# Patient Record
Sex: Male | Born: 1995 | Race: White | Hispanic: No | Marital: Single | Smoking: Never smoker
Health system: Southern US, Community
[De-identification: ages and names within clinical notes are randomized; demographics above are authoritative.]

## PROBLEM LIST (undated history)

## (undated) HISTORY — PX: HERNIA REPAIR: SHX51

---
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
## 2016-03-03 ENCOUNTER — Encounter: Payer: Self-pay | Admitting: Allergy and Immunology

## 2016-03-03 ENCOUNTER — Ambulatory Visit (INDEPENDENT_AMBULATORY_CARE_PROVIDER_SITE_OTHER): Payer: BLUE CROSS/BLUE SHIELD | Admitting: Allergy and Immunology

## 2016-03-03 VITALS — BP 110/78 | HR 80 | Temp 97.7°F | Resp 16 | Ht 69.69 in | Wt 140.7 lb

## 2016-03-03 DIAGNOSIS — Z889 Allergy status to unspecified drugs, medicaments and biological substances status: Secondary | ICD-10-CM

## 2016-03-03 MED ORDER — AMOXICILLIN 200 MG/5ML PO SUSR: ORAL | Status: DC

## 2016-03-03 NOTE — Progress Notes (Signed)
New Patient Note  RE: Isaac Atkins MRN: [id number] DOB: [birth year] Date of Office Visit: 03/03/2016  Referring provider: Excell SeltzerBedsole, Amy E, MD Primary care provider: Kerby NoraAmy Bedsole, MD  Chief Complaint: History of Drug Allergy and Allergy Testing   History of present illness: HPI Comments: Isaac Atkins is a 20 y.o. male presenting today for consultation of possible penicillin allergy.  He has submitted an application for the [employer] and has been told that penicillin allergy has to be ruled out.  Apparently, as an infant he was given penicillin and vomited.  He is uncertain if there were additional symptoms.  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   Assessment and plan: History of drug allergy Epicutaneous and intradermal skin tests were negative to Pre-Pen and pen G and all dilutions.  We will proceed to amoxicillin challenge.  The patient is scheduled to return next week for graded amoxicillin challenge.  Further recommendations will be made at that time based upon skin test results.    Meds ordered this encounter  Medications  . amoxicillin (AMOXIL) 200 MG/5ML suspension    Sig: BRING IN TO CLINIC FOR ORAL CHALLENGE    Dispense:  100 mL    Refill:  0    DO NOT MIX-LEAVE AS POWDER FOR ORAL CHALLENGE IN CLINIC    Diagnositics: Pre-Pen and pen G skin testing: Negative at all dilutions despite a positive histamine control.    Physical examination: Blood pressure 110/78, pulse 80, temperature 97.7 F (36.5 C), temperature source Oral, resp. rate 16, height 5' 9.69" (1.77 m), weight 140 lb 10.5 oz (63.8 kg).  General: Alert, interactive, in no acute distress. Lungs: Clear to auscultation without wheezing, rhonchi or rales. CV: Normal S1, S2 without murmurs. Abdomen: Nondistended, nontender. Skin: Warm and dry, without lesions or rashes. Extremities:  No clubbing, cyanosis or edema. Neuro:   Grossly  intact.  Review of systems:  Review of Systems  Constitutional: Negative for fever, chills and weight loss.  HENT: Negative for congestion and nosebleeds.   Eyes: Negative for blurred vision.  Respiratory: Negative for cough, hemoptysis, shortness of breath and wheezing.   Cardiovascular: Negative for chest pain.  Gastrointestinal: Negative for diarrhea and constipation.  Genitourinary: Negative for dysuria.  Musculoskeletal: Negative for myalgias and joint pain.  Neurological: Negative for dizziness.  Endo/Heme/Allergies: Negative for environmental allergies. Does not bruise/bleed easily.    Past medical history:  History reviewed. No pertinent past medical history.  Past surgical history:  Past Surgical History  Procedure Laterality Date  . Hernia repair      Family history: Family History  Problem Relation Age of Onset  . Sudden death Neg Hx   . Heart attack Neg Hx   . Allergic rhinitis Neg Hx   . Asthma Neg Hx     Social history: Social History   Social History  . Marital Status: Single    Spouse Name: N/A  . Number of Children: N/A  . Years of Education: N/A   Occupational History  . Not on file.   Social History Main Topics  . Smoking status: Never Smoker   . Smokeless tobacco: Never Used  . Alcohol Use: No  . Drug Use: No  . Sexual Activity: Not on file   Other Topics Concern  . Not on file   Social History Narrative   Environmental History: The patient lives in an old house with hardwood floors  throughout and central air/heat.  There are dogs in the house which have access to his bedroom.  He is a nonsmoker.    Medication List       This list is accurate as of: 03/03/16  1:05 PM.  Always use your most recent med list.               amoxicillin 200 MG/5ML suspension  Commonly known as:  AMOXIL  BRING IN TO CLINIC FOR ORAL CHALLENGE        Known medication allergies: Allergies  Allergen Reactions  . Penicillins     REACTION: Nausea  and vomiting    I appreciate the opportunity to take part in this Isaac Atkins's care. Please do not hesitate to contact me with questions.  Sincerely,   R. Jorene Guest, MD Possible penicillin allergy.

## 2016-03-03 NOTE — Assessment & Plan Note (Signed)
Epicutaneous and intradermal skin tests were negative to Pre-Pen and pen G and all dilutions.  We will proceed to amoxicillin challenge.  The patient is scheduled to return next week for graded amoxicillin challenge.  Further recommendations will be made at that time based upon skin test results.

## 2016-03-03 NOTE — Patient Instructions (Signed)
History of drug allergy Epicutaneous and intradermal skin tests were negative to Pre-Pen and pen G and all dilutions.  We will proceed to amoxicillin challenge.  The patient is scheduled to return next week for graded amoxicillin challenge.  Further recommendations will be made at that time based upon skin test results.   Return in about 7 days (around 03/10/2016) for amoxicillin challenge.

## 2016-03-10 ENCOUNTER — Telehealth: Payer: Self-pay | Admitting: Allergy and Immunology

## 2016-03-10 ENCOUNTER — Ambulatory Visit (INDEPENDENT_AMBULATORY_CARE_PROVIDER_SITE_OTHER): Payer: BLUE CROSS/BLUE SHIELD | Admitting: Allergy and Immunology

## 2016-03-10 ENCOUNTER — Encounter: Payer: Self-pay | Admitting: Allergy and Immunology

## 2016-03-10 VITALS — BP 108/74 | HR 76 | Resp 16

## 2016-03-10 DIAGNOSIS — Z889 Allergy status to unspecified drugs, medicaments and biological substances status: Secondary | ICD-10-CM | POA: Diagnosis not present

## 2016-03-10 NOTE — Assessment & Plan Note (Signed)
The patient had negative skin tests to Pre-Pen and penicillin G and was able to tolerate the graded amoxicillin challenge today without adverse signs or symptoms. Therefore, Isaac Atkins has the same risk of systemic reaction associated with penicillin as the general population.

## 2016-03-10 NOTE — Telephone Encounter (Signed)
I called Isaac Atkins to check in and make sure that he was not having any symptoms after the amoxicillin challenge.  He assured me that he was feeling well without any adverse cutaneous, cardiopulmonary, or GI symptoms.  I asked him to contact the office or the on-call physician should he develop symptoms and, if he developed serious symptoms to call 911.  He verbalized understanding and agreed to do so.

## 2016-03-10 NOTE — Patient Instructions (Addendum)
History of drug allergy The patient had negative skin tests to Pre-Pen and penicillin G and was able to tolerate the graded amoxicillin challenge today without adverse signs or symptoms. Therefore, Isaac Atkins has the same risk of systemic reaction associated with penicillin as the general population.    Return visit to be determined.

## 2016-03-10 NOTE — Progress Notes (Signed)
    Follow-up Note  RE: Isaac Atkins MRN: [id number] DOB: 04/14/1996 Date of Office Visit: 03/10/2016  Primary care provider: Kerby NoraAmy Bedsole, MD Referring provider: Excell SeltzerBedsole, Amy E, MD  History of present illness: HPI Comments: Isaac Atkins is a 20 y.o. male presents today for amoxicillin challenge.  During his previous office visit he had negative skin tests to Pre-Pen and penicillin G.   Assessment and plan: History of drug allergy The patient had negative skin tests to Pre-Pen and penicillin G and was able to tolerate the graded amoxicillin challenge today without adverse signs or symptoms. Therefore, Isaac Pertvan has the same risk of systemic reaction associated with penicillin as the general population.    Diagnositics: Graded amoxicillin challenge: The patient was able to tolerate the graded amoxicillin challenge today without adverse signs or symptoms. Vital signs were stable throughout the challenge and observation period.     Physical examination: Blood pressure 108/74, pulse 76, resp. rate 16.  General: Alert, interactive, in no acute distress. Lungs: Clear to auscultation without wheezing, rhonchi or rales. CV: Normal S1, S2 without murmurs. Skin: Warm and dry, without lesions or rashes.  The following portions of the patient's history were reviewed and updated as appropriate: allergies, current medications, past family history, past medical history, past social history, past surgical history and problem list.    Medication List       This list is accurate as of: 03/10/16  7:37 PM.  Always use your most recent med list.               amoxicillin 200 MG/5ML suspension  Commonly known as:  AMOXIL  BRING IN TO CLINIC FOR ORAL CHALLENGE        Allergies  Allergen Reactions  . Penicillins     REACTION: Nausea and vomiting    I appreciate the opportunity to take part in this Isaac Atkins's care. Please do not hesitate to contact me with questions.  Sincerely,   R. Jorene Guestarter  Ladislaus Repsher, MD

## 2016-03-11 ENCOUNTER — Telehealth: Payer: Self-pay

## 2016-03-11 NOTE — Telephone Encounter (Signed)
Patient was just calling to let Dr. Nunzio CobbsBobbitt know he has been doing fine after his challenge.   Thanks

## 2016-03-11 NOTE — Telephone Encounter (Signed)
Noted, thanks.  I will write a letter for his chief medical officer stating that he is not allergic to penicillin.

## 2016-03-27 ENCOUNTER — Ambulatory Visit (INDEPENDENT_AMBULATORY_CARE_PROVIDER_SITE_OTHER): Payer: BLUE CROSS/BLUE SHIELD | Admitting: Family Medicine

## 2016-03-27 ENCOUNTER — Ambulatory Visit (HOSPITAL_BASED_OUTPATIENT_CLINIC_OR_DEPARTMENT_OTHER)
Admission: RE | Admit: 2016-03-27 | Discharge: 2016-03-27 | Disposition: A | Discharge status: Home / Self Care | Payer: BLUE CROSS/BLUE SHIELD | Source: Ambulatory Visit | Attending: Family Medicine | Admitting: Family Medicine

## 2016-03-27 ENCOUNTER — Encounter: Payer: Self-pay | Admitting: Family Medicine

## 2016-03-27 VITALS — BP 113/71 | HR 65 | Ht 71.0 in | Wt 140.0 lb

## 2016-03-27 DIAGNOSIS — M79671 Pain in right foot: Secondary | ICD-10-CM | POA: Insufficient documentation

## 2016-03-27 DIAGNOSIS — S99921A Unspecified injury of right foot, initial encounter: Secondary | ICD-10-CM | POA: Diagnosis not present

## 2016-03-27 NOTE — Progress Notes (Signed)
PCP: Kerby NoraAmy Bedsole, MD  Subjective:   HPI: Patient is a 20 y.o. male here for right foot injury.  Patient reports 1 week ago he was skateboarding when he inverted his right ankle. Immediate pain, some swelling lateral foot dorsally. Pain level is 3/10 and sharp at worst. Has improved since the initial injury. Worse with walking, better with rest. No skin changes, numbness.  No past medical history on file.  Current Outpatient Prescriptions on File Prior to Visit  Medication Sig Dispense Refill  . amoxicillin (AMOXIL) 200 MG/5ML suspension BRING IN TO CLINIC FOR ORAL CHALLENGE (Patient not taking: Reported on 03/10/2016) 100 mL 0   No current facility-administered medications on file prior to visit.    Past Surgical History  Procedure Laterality Date  . Hernia repair      No Active Allergies  Social History   Social History  . Marital Status: Single    Spouse Name: N/A  . Number of Children: N/A  . Years of Education: N/A   Occupational History  . Not on file.   Social History Main Topics  . Smoking status: Never Smoker   . Smokeless tobacco: Never Used  . Alcohol Use: No  . Drug Use: No  . Sexual Activity: Not on file   Other Topics Concern  . Not on file   Social History Narrative    Family History  Problem Relation Age of Onset  . Sudden death Neg Hx   . Heart attack Neg Hx   . Allergic rhinitis Neg Hx   . Asthma Neg Hx     BP 113/71 mmHg  Pulse 65  Ht 5\' 11"  (1.803 m)  Wt 140 lb (63.504 kg)  BMI 19.53 kg/m2  Review of Systems: See HPI above.    Objective:  Physical Exam:  Gen: NAD, comfortable in exam room  Right foot/ankle: No gross deformity, swelling, ecchymoses FROM ankle without pain. TTP mildly over cuboid only.  No other tenderness. Negative ant drawer and talar tilt.   Negative syndesmotic compression. Thompsons test negative. NV intact distally.  Left foot/ankle: FROM without pain.    Assessment & Plan:  1. Right foot  injury - independently reviewed radiographs today and no evidence fracture or other acute abnormalities.  Brief MSK u/s with normal peroneal tendons, no occult fracture.  2/2 sprain.  Reassured patient.  Icing, tylenol or nsaids if needed.  F/u in 4 weeks if needed.

## 2016-03-27 NOTE — Assessment & Plan Note (Signed)
independently reviewed radiographs today and no evidence fracture or other acute abnormalities.  Brief MSK u/s with normal peroneal tendons, no occult fracture.  2/2 sprain.  Reassured patient.  Icing, tylenol or nsaids if needed.  F/u in 4 weeks if needed.

## 2016-03-27 NOTE — Patient Instructions (Signed)
You have a foot sprain. Expect this to take another 1-3 weeks to completely resolve. Ice the area 15 minutes at a time 3-4 times a day. Consider boot or postop shoe (I don't think you need this unless pain is severe) when up and walking around. Ibuprofen 600mg  three times a day with food OR aleve 2 tabs twice a day with food for pain and inflammation. Follow up with me in 4 weeks if you're having problems.

## 2016-10-27 IMAGING — CR DG FOOT COMPLETE 3+V*R*
3 series · 3 of 3 positions shown · non-contrast
Comparison: None.

CLINICAL DATA: Lateral and anterior foot pain from skateboard
injury a few days ago. Cuboid region pain. Initial encounter.

EXAM:
RIGHT FOOT COMPLETE - 3+ VIEW

[t foot ap right]
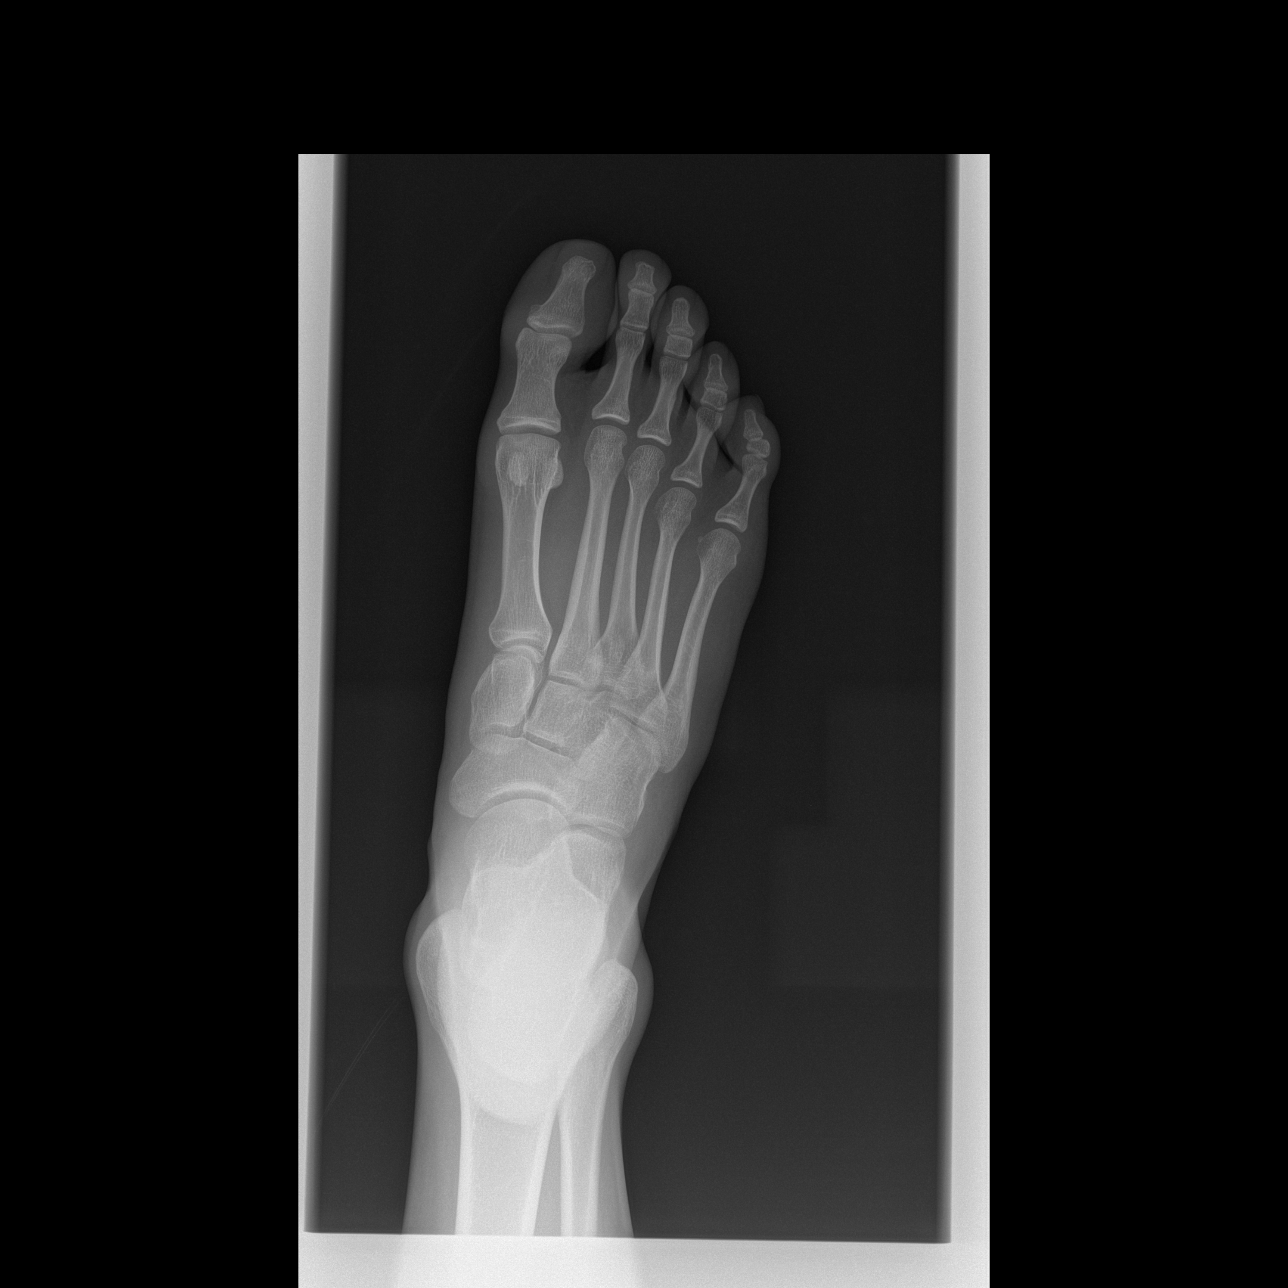

[t foot oblique right]
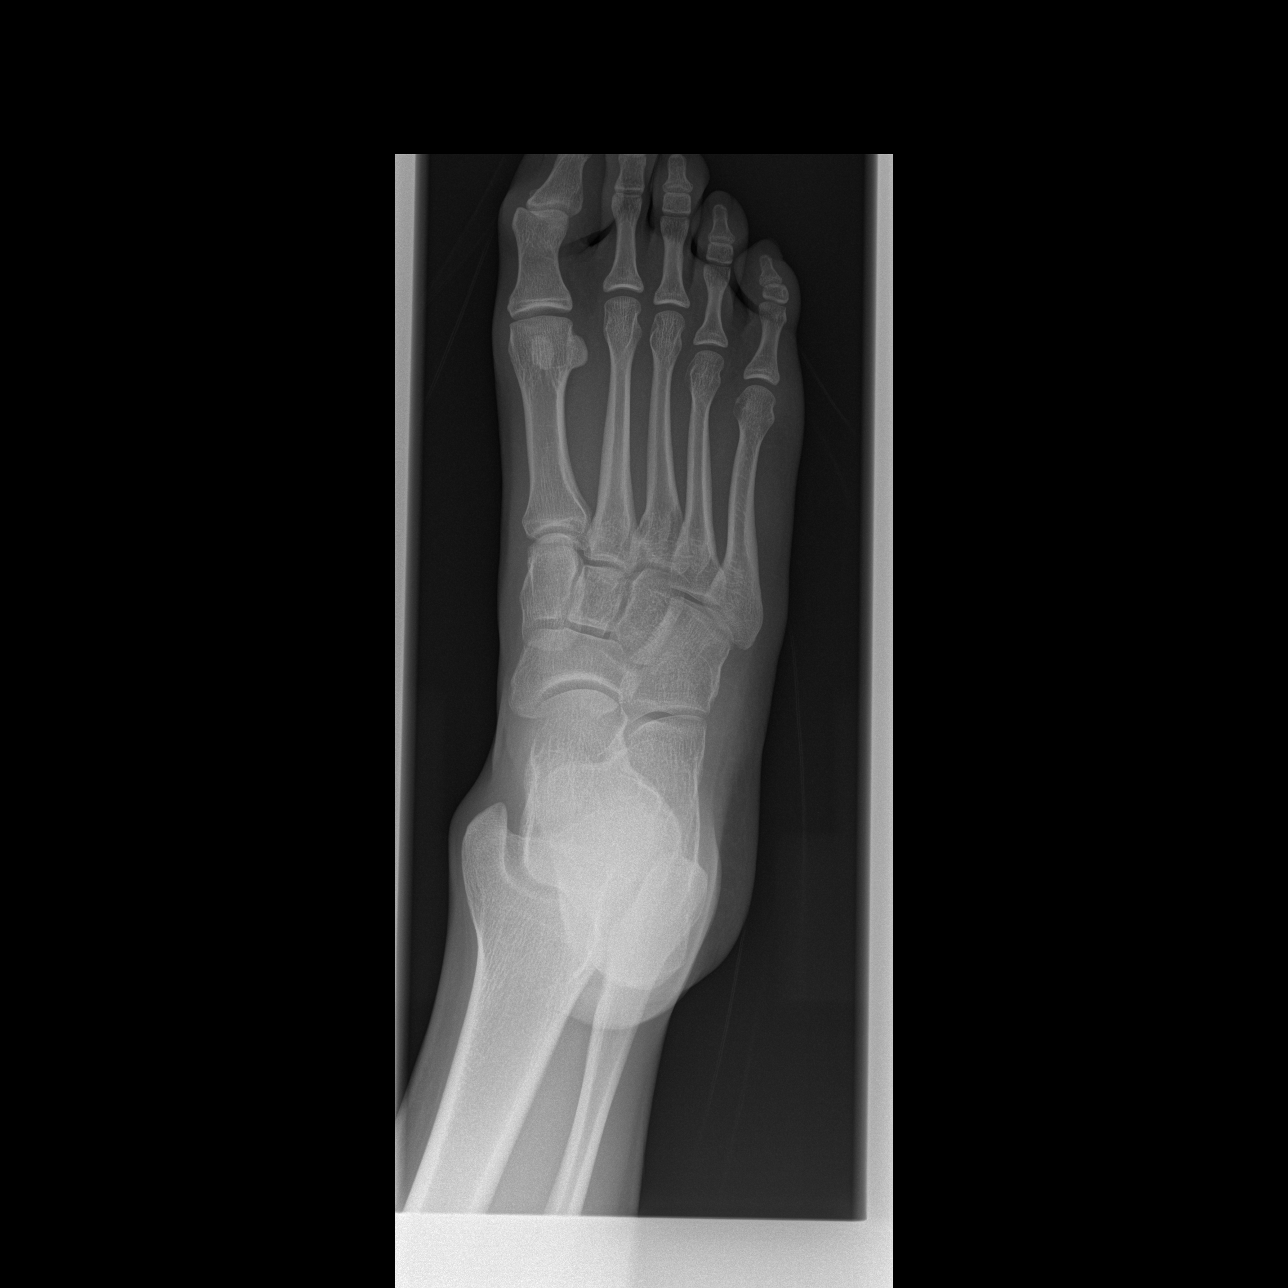

[t foot lat right]
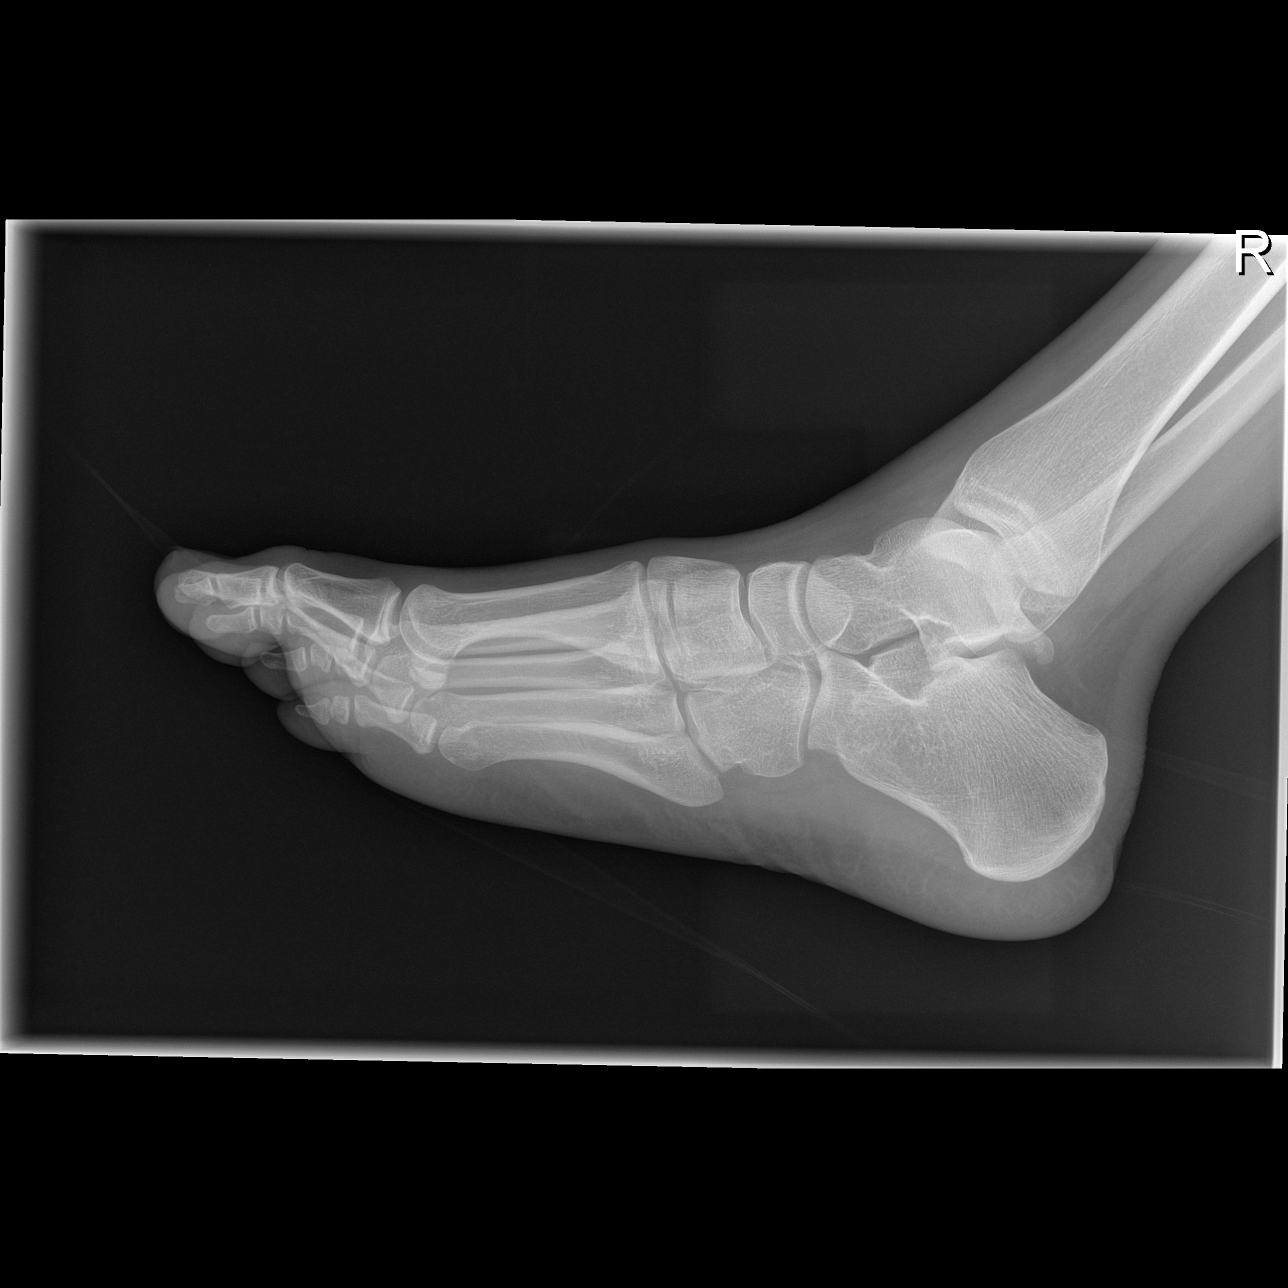

[3 of 3 positions shown; findings below may reference images not displayed]

FINDINGS: There is no evidence of fracture or dislocation. Soft tissues are
unremarkable.
IMPRESSION: Normal exam.

## 2021-11-06 ENCOUNTER — Other Ambulatory Visit: Payer: Self-pay

## 2021-11-06 ENCOUNTER — Encounter: Payer: Self-pay | Admitting: Emergency Medicine

## 2021-11-06 ENCOUNTER — Ambulatory Visit
Admission: EM | Admit: 2021-11-06 | Discharge: 2021-11-06 | Disposition: A | Discharge status: Home / Self Care | Payer: BLUE CROSS/BLUE SHIELD | Attending: Internal Medicine | Admitting: Internal Medicine

## 2021-11-06 DIAGNOSIS — Z113 Encounter for screening for infections with a predominantly sexual mode of transmission: Secondary | ICD-10-CM

## 2021-11-06 DIAGNOSIS — R300 Dysuria: Secondary | ICD-10-CM

## 2021-11-06 DIAGNOSIS — R369 Urethral discharge, unspecified: Secondary | ICD-10-CM

## 2021-11-06 LAB — POCT URINALYSIS DIP (MANUAL ENTRY)
Bilirubin, UA: NEGATIVE
Blood, UA: NEGATIVE
Glucose, UA: NEGATIVE mg/dL
Ketones, POC UA: NEGATIVE mg/dL
Leukocytes, UA: NEGATIVE
Nitrite, UA: NEGATIVE
Protein Ur, POC: NEGATIVE mg/dL
Spec Grav, UA: 1.015 (ref 1.010–1.025)
Urobilinogen, UA: 0.2 E.U./dL
pH, UA: 7 (ref 5.0–8.0)

## 2021-11-06 NOTE — ED Provider Notes (Signed)
[facility]    CSN: [id number] Arrival date & time: 11/06/21  1045      History   Chief Complaint Chief Complaint  Patient presents with   Dysuria   SEXUALLY TRANSMITTED DISEASE    HPI Isaac Atkins is a 26 y.o. male.   Patient presents with urinary burning and white penile discharge that started approximately 1 week ago.  Denies urinary frequency, testicular pain, pelvic pain, abdominal pain, back pain, fever.  Denies any known exposure to STD but has had unprotected sexual intercourse recently.  Patient requesting STD testing but does not want HIV or syphilis blood work.   Dysuria  History reviewed. No pertinent past medical history.  Patient Active Problem List   Diagnosis Date Noted   Right foot injury 03/27/2016   History of drug allergy 03/03/2016   Scabies 11/08/2015   Corneal abrasion, right 01/12/2013   Wears contact lenses 01/12/2013   Sports physical 09/04/2011   COMMON MIGRAINE 08/09/2007    Past Surgical History:  Procedure Laterality Date   HERNIA REPAIR         Home Medications    Prior to Admission medications   Medication Sig Start Date End Date Taking? Authorizing Provider  amoxicillin (AMOXIL) 200 MG/5ML suspension BRING IN TO CLINIC FOR ORAL CHALLENGE Patient not taking: Reported on 03/10/2016 03/03/16   Bobbitt, Heywood Iles, MD    Family History Family History  Problem Relation Age of Onset   Sudden death Neg Hx    Heart attack Neg Hx    Allergic rhinitis Neg Hx    Asthma Neg Hx     Social History Social History   Tobacco Use   Smoking status: Never   Smokeless tobacco: Never  Substance Use Topics   Alcohol use: No    Alcohol/week: 0.0 standard drinks   Drug use: No     Allergies   Patient has no known allergies.   Review of Systems Review of Systems Per HPI  Physical Exam Triage Vital Signs ED Triage Vitals  Enc Vitals Group     BP 11/06/21 1302 (!) 146/88     Pulse Rate 11/06/21 1302 64     Resp  11/06/21 1302 16     Temp 11/06/21 1302 98.1 F (36.7 C)     Temp Source 11/06/21 1302 Oral     SpO2 11/06/21 1302 96 %     Weight --      Height --      Head Circumference --      Peak Flow --      Pain Score 11/06/21 1304 0     Pain Loc --      Pain Edu? --      Excl. in GC? --    No data found.  Updated Vital Signs BP (!) 146/88 (BP Location: Left Arm)    Pulse 64    Temp 98.1 F (36.7 C) (Oral)    Resp 16    SpO2 96%   Visual Acuity Right Eye Distance:   Left Eye Distance:   Bilateral Distance:    Right Eye Near:   Left Eye Near:    Bilateral Near:     Physical Exam Constitutional:      General: He is not in acute distress.    Appearance: Normal appearance. He is not toxic-appearing or diaphoretic.  HENT:     Head: Normocephalic and atraumatic.  Eyes:     Extraocular Movements: Extraocular movements intact.  Conjunctiva/sclera: Conjunctivae normal.  Pulmonary:     Effort: Pulmonary effort is normal.  Genitourinary:    Comments: Deferred with shared decision making.  Self swab performed. Neurological:     General: No focal deficit present.     Mental Status: He is alert and oriented to person, place, and time. Mental status is at baseline.  Psychiatric:        Mood and Affect: Mood normal.        Behavior: Behavior normal.        Thought Content: Thought content normal.        Judgment: Judgment normal.     UC Treatments / Results  Labs (all labs ordered are listed, but only abnormal results are displayed) Labs Reviewed  POCT URINALYSIS DIP (MANUAL ENTRY)  CYTOLOGY, (ORAL, ANAL, URETHRAL) ANCILLARY ONLY    EKG   Radiology No results found.  Procedures Procedures (including critical care time)  Medications Ordered in UC Medications - No data to display  Initial Impression / Assessment and Plan / UC Course  I have reviewed the triage vital signs and the nursing notes.  Pertinent labs & imaging results that were available during my care  of the patient were reviewed by me and considered in my medical decision making (see chart for details).     Urinalysis not showing signs of urinary tract infection.  Do not think that urine culture is needed given what urinalysis shows.  Cytology swab pending.  Highly suspicious for STD.  Patient to refrain from sexual activity until test results and treatment are complete.  Will await results of cytology swab for treatment.  Patient verbalized understanding and was agreeable with plan. Final Clinical Impressions(s) / UC Diagnoses   Final diagnoses:  Penile discharge  Dysuria  Screening examination for venereal disease     Discharge Instructions      Your urine did not show signs of urinary tract infection.  Your STD swab is pending.  We will call if it is positive and treat as appropriate.  Please refrain from sexual activity until test results and treatment are complete.    ED Prescriptions   None    PDMP not reviewed this encounter.   Gustavus Bryant, [place] 11/06/21 1343

## 2021-11-06 NOTE — ED Triage Notes (Signed)
Reports dysuria with discharge x 1 week. Concerned for STD or UTI. Denies rash. Requesting STD swab, not blood work

## 2021-11-06 NOTE — Discharge Instructions (Signed)
Your urine did not show signs of urinary tract infection.  Your STD swab is pending.  We will call if it is positive and treat as appropriate.  Please refrain from sexual activity until test results and treatment are complete.

## 2021-11-07 LAB — CYTOLOGY, (ORAL, ANAL, URETHRAL) ANCILLARY ONLY
Chlamydia: NEGATIVE
Comment: NEGATIVE
Comment: NEGATIVE
Comment: NORMAL
Neisseria Gonorrhea: NEGATIVE
Trichomonas: NEGATIVE

## 2022-01-19 ENCOUNTER — Encounter: Payer: Self-pay | Admitting: Emergency Medicine

## 2022-01-19 ENCOUNTER — Ambulatory Visit (INDEPENDENT_AMBULATORY_CARE_PROVIDER_SITE_OTHER): Payer: 59

## 2022-01-19 ENCOUNTER — Other Ambulatory Visit: Payer: Self-pay

## 2022-01-19 ENCOUNTER — Ambulatory Visit
Admission: EM | Admit: 2022-01-19 | Discharge: 2022-01-19 | Disposition: A | Discharge status: Home / Self Care | Payer: 59 | Attending: Internal Medicine | Admitting: Internal Medicine

## 2022-01-19 DIAGNOSIS — J069 Acute upper respiratory infection, unspecified: Secondary | ICD-10-CM | POA: Insufficient documentation

## 2022-01-19 DIAGNOSIS — H109 Unspecified conjunctivitis: Secondary | ICD-10-CM | POA: Insufficient documentation

## 2022-01-19 DIAGNOSIS — R059 Cough, unspecified: Secondary | ICD-10-CM

## 2022-01-19 DIAGNOSIS — B9689 Other specified bacterial agents as the cause of diseases classified elsewhere: Secondary | ICD-10-CM | POA: Diagnosis present

## 2022-01-19 DIAGNOSIS — R051 Acute cough: Secondary | ICD-10-CM | POA: Diagnosis not present

## 2022-01-19 DIAGNOSIS — J029 Acute pharyngitis, unspecified: Secondary | ICD-10-CM | POA: Insufficient documentation

## 2022-01-19 LAB — POCT RAPID STREP A (OFFICE): Rapid Strep A Screen: NEGATIVE

## 2022-01-19 MED ORDER — OFLOXACIN 0.3 % OP SOLN: OPHTHALMIC | 0 refills | Status: AC

## 2022-01-19 MED ORDER — BENZONATATE 100 MG PO CAPS: 100.0000 mg | ORAL_CAPSULE | Freq: Three times a day (TID) | ORAL | 0 refills | Status: DC | PRN

## 2022-01-19 MED ORDER — AZITHROMYCIN 500 MG PO TABS: ORAL_TABLET | ORAL | 0 refills | Status: AC

## 2022-01-19 MED ORDER — PREDNISONE 20 MG PO TABS: 40.0000 mg | ORAL_TABLET | Freq: Every day | ORAL | 0 refills | Status: AC

## 2022-01-19 NOTE — Discharge Instructions (Signed)
You have been prescribed 4 different medications for symptoms including eyedrops.  Please follow-up with eye doctor tomorrow for further evaluation and management. ?

## 2022-01-19 NOTE — ED Triage Notes (Signed)
Sore throat, nasal congestion, cough, bilateral eye irritation x 3 weeks. Negative at home covid test. ?

## 2022-01-19 NOTE — ED Provider Notes (Addendum)
?[facility] ? ? ? ?CSN: [id number] ?Arrival date & time: 01/19/22  1828 ? ? ?  ? ?History   ?Chief Complaint ?Chief Complaint  ?Patient presents with  ? Sore Throat  ? ? ?HPI ?Isaac Atkins is a 26 y.o. male.  ? ?Patient presents with sore throat, nasal congestion, cough that has been present for approximately 3 weeks.  He has been around people with similar symptoms.  Patient has taken over-the-counter cold and flu medications with minimal improvement in symptoms.  Denies any known fevers.  Patient also has bilateral eye redness and irritation that started this morning upon awakening.  Patient is attributing this to sleeping in his contacts.  Denies any purulent drainage or blurry vision.  Patient took a COVID test at home that was negative.  Denies chest pain, shortness of breath, nausea, vomiting, diarrhea, abdominal pain. ? ? ?Sore Throat ? ? ?History reviewed. No pertinent past medical history. ? ?Patient Active Problem List  ? Diagnosis Date Noted  ? Right foot injury 03/27/2016  ? History of drug allergy 03/03/2016  ? Scabies 11/08/2015  ? Corneal abrasion, right 01/12/2013  ? Wears contact lenses 01/12/2013  ? Sports physical 09/04/2011  ? COMMON MIGRAINE 08/09/2007  ? ? ?Past Surgical History:  ?Procedure Laterality Date  ? HERNIA REPAIR    ? ? ? ? ? ?Home Medications   ? ?Prior to Admission medications   ?Medication Sig Start Date End Date Taking? Authorizing Provider  ?azithromycin (ZITHROMAX) 500 MG tablet Take 1 tablet (500 mg total) by mouth daily for 1 day, THEN 0.5 tablets (250 mg total) daily for 4 days. 01/19/22 01/24/22 Yes Oprah Camarena, Acie FredricksonHaley E, FNP  ?benzonatate (TESSALON) 100 MG capsule Take 1 capsule (100 mg total) by mouth every 8 (eight) hours as needed for cough. 01/19/22  Yes Adler Alton, Acie FredricksonHaley E, FNP  ?ofloxacin (OCUFLOX) 0.3 % ophthalmic solution Place 1 drop into both eyes every 4 (four) hours for 2 days, THEN 1 drop 4 (four) times daily for 5 days. 01/19/22 01/26/22 Yes Gustavus BryantMound, Jahmez Bily E, FNP   ?predniSONE (DELTASONE) 20 MG tablet Take 2 tablets (40 mg total) by mouth daily for 5 days. 01/19/22 01/24/22 Yes TRUE Shackleford, Acie FredricksonHaley E, FNP  ?amoxicillin (AMOXIL) 200 MG/5ML suspension BRING IN TO CLINIC FOR ORAL CHALLENGE ?Patient not taking: Reported on 03/10/2016 03/03/16   Bobbitt, Heywood Ilesalph Carter, MD  ? ? ?Family History ?Family History  ?Problem Relation Age of Onset  ? Sudden death Neg Hx   ? Heart attack Neg Hx   ? Allergic rhinitis Neg Hx   ? Asthma Neg Hx   ? ? ?Social History ?Social History  ? ?Tobacco Use  ? Smoking status: Never  ? Smokeless tobacco: Never  ?Substance Use Topics  ? Alcohol use: No  ?  Alcohol/week: 0.0 standard drinks  ? Drug use: No  ? ? ? ?Allergies   ?Patient has no known allergies. ? ? ?Review of Systems ?Review of Systems ?Per HPI ? ?Physical Exam ?Triage Vital Signs ?ED Triage Vitals  ?Enc Vitals Group  ?   BP 01/19/22 1855 139/90  ?   Pulse Rate 01/19/22 1855 64  ?   Resp 01/19/22 1855 16  ?   Temp 01/19/22 1855 98.7 ?F (37.1 ?C)  ?   Temp Source 01/19/22 1855 Oral  ?   SpO2 01/19/22 1855 98 %  ?   Weight --   ?   Height --   ?   Head Circumference --   ?  Peak Flow --   ?   Pain Score 01/19/22 1856 0  ?   Pain Loc --   ?   Pain Edu? --   ?   Excl. in GC? --   ? ?No data found. ? ?Updated Vital Signs ?BP 139/90 (BP Location: Left Arm)   Pulse 64   Temp 98.7 ?F (37.1 ?C) (Oral)   Resp 16   SpO2 98%  ? ?Visual Acuity ?Right Eye Distance: 20/50 ?Left Eye Distance: 20/25 ?Bilateral Distance: 20/25 ? ?Right Eye Near:   ?Left Eye Near:    ?Bilateral Near:    ? ?Physical Exam ?Constitutional:   ?   General: He is not in acute distress. ?   Appearance: Normal appearance. He is not toxic-appearing or diaphoretic.  ?HENT:  ?   Head: Normocephalic and atraumatic.  ?   Right Ear: Tympanic membrane and ear canal normal.  ?   Left Ear: Tympanic membrane and ear canal normal.  ?   Nose: Congestion present.  ?   Mouth/Throat:  ?   Mouth: Mucous membranes are moist.  ?   Pharynx: Posterior oropharyngeal  erythema present.  ?Eyes:  ?   General: Lids are normal. Lids are everted, no foreign bodies appreciated. Vision grossly intact. Gaze aligned appropriately.  ?   Extraocular Movements: Extraocular movements intact.  ?   Conjunctiva/sclera:  ?   Right eye: Right conjunctiva is injected. No chemosis, exudate or hemorrhage. ?   Left eye: Left conjunctiva is injected. No chemosis, exudate or hemorrhage. ?   Pupils: Pupils are equal, round, and reactive to light.  ?   Comments: Scleral redness present bilaterally.    ?Cardiovascular:  ?   Rate and Rhythm: Normal rate and regular rhythm.  ?   Pulses: Normal pulses.  ?   Heart sounds: Normal heart sounds.  ?Pulmonary:  ?   Effort: Pulmonary effort is normal. No respiratory distress.  ?   Breath sounds: Normal breath sounds. No stridor. No wheezing, rhonchi or rales.  ?Abdominal:  ?   General: Abdomen is flat. Bowel sounds are normal.  ?   Palpations: Abdomen is soft.  ?Musculoskeletal:     ?   General: Normal range of motion.  ?   Cervical back: Normal range of motion.  ?Skin: ?   General: Skin is warm and dry.  ?Neurological:  ?   General: No focal deficit present.  ?   Mental Status: He is alert and oriented to person, place, and time. Mental status is at baseline.  ?Psychiatric:     ?   Mood and Affect: Mood normal.     ?   Behavior: Behavior normal.  ? ? ? ?UC Treatments / Results  ?Labs ?(all labs ordered are listed, but only abnormal results are displayed) ?Labs Reviewed  ?CULTURE, GROUP A STREP [facility])  ?POCT RAPID STREP A (OFFICE)  ? ? ?EKG ? ? ?Radiology ?DG Chest 2 View ? ?Result Date: 01/19/2022 ?CLINICAL DATA:  Cough and URI symptoms for 3x weeks normal cardiomediastinal contours EXAM: CHEST - 2 VIEW COMPARISON:  None. FINDINGS: Normal cardiomediastinal contours. There is mild diffuse coarsening of the interstitium. No focal consolidation. No pneumothorax or pleural effusion. No acute finding in the visualized skeleton. IMPRESSION: Mild diffuse coarsening of the  interstitium bilaterally, nonspecific, could represent atypical infection versus reactive airways disease. Electronically Signed   By: Emmaline Kluver M.D.   On: 01/19/2022 19:17   ? ?Procedures ?Procedures (including critical care time) ? ?  Medications Ordered in UC ?Medications - No data to display ? ?Initial Impression / Assessment and Plan / UC Course  ?I have reviewed the triage vital signs and the nursing notes. ? ?Pertinent labs & imaging results that were available during my care of the patient were reviewed by me and considered in my medical decision making (see chart for details). ? ?  ? ?Chest x-ray showing signs of atypical infection versus reactive airway disease.  Will treat with azithromycin to cover for atypicals.  Prednisone steroid prescribed to help alleviate inflammation and airway with associated possible bronchitis. Ofloxacin to treat bilateral eye irritation as well given patient is a contact lens wear.  Visual acuity appears fairly normal.  Differential diagnosis of eyes includes bacterial conjunctivitis versus eye irritation from contact lenses.  Do not think the patient needs immediate referral to ophthalmology at this time.  Patient advised to follow-up with eye doctor tomorrow with provided address and information for further evaluation and management.  I do think that prednisone and ofloxacin is safe given the ofloxacin is not systemic and should not have any systemic absorption.  Benzonatate also prescribed for cough.  Discussed return precautions.  Patient verbalized understanding and was agreeable with plan. ?Final Clinical Impressions(s) / UC Diagnoses  ? ?Final diagnoses:  ?Acute upper respiratory infection  ?Acute cough  ?Bacterial conjunctivitis of both eyes  ?Sore throat  ? ? ? ?Discharge Instructions   ? ?  ?You have been prescribed 4 different medications for symptoms including eyedrops.  Please follow-up with eye doctor tomorrow for further evaluation and  management. ? ? ? ?ED Prescriptions   ? ? Medication Sig Dispense Auth. Provider  ? azithromycin (ZITHROMAX) 500 MG tablet Take 1 tablet (500 mg total) by mouth daily for 1 day, THEN 0.5 tablets (250 mg total) daily for 4 da

## 2022-01-23 LAB — CULTURE, GROUP A STREP (THRC)

## 2022-02-04 ENCOUNTER — Ambulatory Visit (INDEPENDENT_AMBULATORY_CARE_PROVIDER_SITE_OTHER): Payer: 59 | Admitting: Nurse Practitioner

## 2022-02-04 ENCOUNTER — Encounter: Payer: Self-pay | Admitting: Nurse Practitioner

## 2022-02-04 VITALS — BP 114/61 | HR 62 | Temp 98.4°F | Ht 70.87 in | Wt 190.1 lb

## 2022-02-04 DIAGNOSIS — Z6826 Body mass index (BMI) 26.0-26.9, adult: Secondary | ICD-10-CM

## 2022-02-04 DIAGNOSIS — Z7689 Persons encountering health services in other specified circumstances: Secondary | ICD-10-CM

## 2022-02-04 NOTE — Progress Notes (Signed)
? ?New Patient Office Visit ? ?Subjective:  ?Patient ID: Isaac Atkins, male    DOB: September 16, 1996  Age: 26 y.o. MRN: [id number] ? ?CC:  ?Chief Complaint  ?Patient presents with  ? New Patient (Initial Visit)  ? ? ?HPI ?Isaac Atkins presents to establish new primary care provider.  ?-spent 4.5 years in [place]. Recently moved back home, here, in [place].  ?-due to have routine physical and labs.  ?-denies current concerns or complaints.  ? ?History reviewed. No pertinent past medical history. ? ?Past Surgical History:  ?Procedure Laterality Date  ? HERNIA REPAIR    ? ? ?Family History  ?Problem Relation Age of Onset  ? Sudden death Neg Hx   ? Heart attack Neg Hx   ? Allergic rhinitis Neg Hx   ? Asthma Neg Hx   ? ? ?Social History  ? ?Socioeconomic History  ? Marital status: Single  ?  Spouse name: Not on file  ? Number of children: Not on file  ? Years of education: Not on file  ? Highest education level: Not on file  ?Occupational History  ? Not on file  ?Tobacco Use  ? Smoking status: Never  ? Smokeless tobacco: Never  ?Substance and Sexual Activity  ? Alcohol use: Yes  ? Drug use: No  ? Sexual activity: Yes  ?  Partners: Female  ?Other Topics Concern  ? Not on file  ?Social History Narrative  ? Not on file  ? ?Social Determinants of Health  ? ?Financial Resource Strain: Not on file  ?Food Insecurity: Not on file  ?Transportation Needs: Not on file  ?Physical Activity: Not on file  ?Stress: Not on file  ?Social Connections: Not on file  ?Intimate Partner Violence: Not on file  ? ? ?ROS ?Review of Systems  ?Constitutional:  Negative for activity change, chills, fatigue and fever.  ?HENT:  Negative for congestion, postnasal drip, rhinorrhea, sinus pressure, sinus pain, sneezing and sore throat.   ?Eyes: Negative.   ?Respiratory:  Negative for cough, shortness of breath and wheezing.   ?Cardiovascular:  Negative for chest pain and palpitations.  ?Gastrointestinal:  Negative for constipation, diarrhea, nausea and vomiting.   ?Endocrine: Negative for cold intolerance, heat intolerance, polydipsia and polyuria.  ?Genitourinary:  Negative for dysuria, frequency and urgency.  ?Musculoskeletal:  Negative for back pain and myalgias.  ?Skin:  Negative for rash.  ?Allergic/Immunologic: Negative for environmental allergies.  ?Neurological:  Negative for dizziness, weakness and headaches.  ?Psychiatric/Behavioral:  The patient is not nervous/anxious.   ? ?Objective:  ? ?Today's Vitals  ? 02/04/22 1407  ?BP: 114/61  ?Pulse: 62  ?Temp: 98.4 ?F (36.9 ?C)  ?SpO2: 97%  ?Weight: 190 lb 1.6 oz (86.2 kg)  ?Height: 5' 10.87" (1.8 m)  ? ?Body mass index is 26.61 kg/m?.  ? ?Physical Exam ?Vitals and nursing note reviewed.  ?Constitutional:   ?   Appearance: Normal appearance. He is well-developed.  ?HENT:  ?   Head: Normocephalic and atraumatic.  ?   Nose: Nose normal.  ?   Mouth/Throat:  ?   Mouth: Mucous membranes are moist.  ?   Pharynx: Oropharynx is clear.  ?Eyes:  ?   Extraocular Movements: Extraocular movements intact.  ?   Conjunctiva/sclera: Conjunctivae normal.  ?   Pupils: Pupils are equal, round, and reactive to light.  ?Cardiovascular:  ?   Rate and Rhythm: Normal rate and regular rhythm.  ?   Pulses: Normal pulses.  ?   Heart sounds:  Normal heart sounds.  ?Pulmonary:  ?   Effort: Pulmonary effort is normal.  ?   Breath sounds: Normal breath sounds.  ?Abdominal:  ?   Palpations: Abdomen is soft.  ?Musculoskeletal:     ?   General: Normal range of motion.  ?   Cervical back: Normal range of motion and neck supple.  ?Lymphadenopathy:  ?   Cervical: No cervical adenopathy.  ?Skin: ?   General: Skin is warm and dry.  ?   Capillary Refill: Capillary refill takes less than 2 seconds.  ?Neurological:  ?   General: No focal deficit present.  ?   Mental Status: He is alert and oriented to person, place, and time.  ?Psychiatric:     ?   Mood and Affect: Mood normal.     ?   Behavior: Behavior normal.     ?   Thought Content: Thought content normal.      ?   Judgment: Judgment normal.  ? ? ?Assessment & Plan:  ?1. Body mass index 26.0-26.9, adult ?Encourage patient to limit calorie intake to 2000 cal/day or less.  He should consume a low cholesterol, low-fat diet.  Recommend he incorporate exercise into his daily routine.  ? ?2. Encounter to establish care ?Appointment today to establish new primary care provider   ? ?  ?Problem List Items Addressed This Visit   ? ?  ? Other  ? Body mass index 26.0-26.9, adult - Primary  ? ?Other Visit Diagnoses   ? ? Encounter to establish care      ? ?  ? ? ?Outpatient Encounter Medications as of 02/04/2022  ?Medication Sig  ? [DISCONTINUED] amoxicillin (AMOXIL) 200 MG/5ML suspension BRING IN TO CLINIC FOR ORAL CHALLENGE (Patient not taking: Reported on 03/10/2016)  ? [DISCONTINUED] benzonatate (TESSALON) 100 MG capsule Take 1 capsule (100 mg total) by mouth every 8 (eight) hours as needed for cough.  ? ?No facility-administered encounter medications on file as of 02/04/2022.  ? ? ?Follow-up: Return in about 6 weeks (around 03/18/2022) for health maintenance exam, FBW a week prior to visit.  ? ?Ronnell Freshwater, NP ? ?

## 2022-02-15 DIAGNOSIS — Z6826 Body mass index (BMI) 26.0-26.9, adult: Secondary | ICD-10-CM | POA: Insufficient documentation

## 2022-03-12 ENCOUNTER — Other Ambulatory Visit: Payer: 59

## 2022-03-18 ENCOUNTER — Encounter: Payer: 59 | Admitting: Nurse Practitioner

## 2022-03-18 NOTE — Progress Notes (Deleted)
Established patient visit   Patient: Isaac Atkins   DOB: [birth year]   25 y.o. Male  MRN: [id number] Visit Date: 03/18/2022   No chief complaint on file.  Subjective    HPI  Presenting for annual physical. Will need to schedule labs. Was to have these done prior to visit    Medications: No outpatient medications prior to visit.   No facility-administered medications prior to visit.    Review of Systems  {Labs (Optional):23779}   Objective    There were no vitals taken for this visit. BP Readings from Last 3 Encounters:  02/04/22 114/61  01/19/22 139/90  11/06/21 (!) 146/88    Wt Readings from Last 3 Encounters:  02/04/22 190 lb 1.6 oz (86.2 kg)  03/27/16 140 lb (63.5 kg) (33 %, Z= -0.45)*  03/03/16 140 lb 10.5 oz (63.8 kg) (34 %, Z= -0.40)*   * Growth percentiles are based on CDC (Boys, 2-20 Years) data.    Physical Exam  ***  No results found for any visits on 03/18/22.  Assessment & Plan     Problem List Items Addressed This Visit   None    No follow-ups on file.         Carlean Jews, NP  [facility] [phone] (phone) [phone] (fax)  [facility]

## 2022-05-01 ENCOUNTER — Ambulatory Visit (INDEPENDENT_AMBULATORY_CARE_PROVIDER_SITE_OTHER): Payer: 59

## 2022-05-01 ENCOUNTER — Ambulatory Visit
Admission: EM | Admit: 2022-05-01 | Discharge: 2022-05-01 | Disposition: A | Discharge status: Home / Self Care | Payer: 59 | Attending: Internal Medicine | Admitting: Internal Medicine

## 2022-05-01 DIAGNOSIS — W19XXXA Unspecified fall, initial encounter: Secondary | ICD-10-CM | POA: Diagnosis not present

## 2022-05-01 DIAGNOSIS — M25572 Pain in left ankle and joints of left foot: Secondary | ICD-10-CM

## 2022-05-01 NOTE — Discharge Instructions (Signed)
Your x-ray did not show any breaks or dislocations.  I suspect that you have an ankle sprain.  An Ace wrap has been applied and crutches has been supplied for you.  Do not sleep in Ace wrap.  Please do not bear any weight until otherwise advised.  Also recommend ice application, elevation of extremity, taking over-the-counter pain relievers as needed.  Please follow-up with orthopedist at provided contact information for further evaluation and management.

## 2022-05-01 NOTE — ED Provider Notes (Addendum)
[facility]    CSN: [id number] Arrival date & time: 05/01/22  2297      History   Chief Complaint Chief Complaint  Patient presents with   Fall    HPI Junaid Wurzer Glasner is a 26 y.o. male.   Patient presents with left ankle pain after a fall that occurred yesterday.  Patient reports that he fell but he is not sure how he fell as he thinks that he misstepped.  He rolled his left ankle and heard a pop.  Denies hitting head or losing consciousness.  Patient has taken ibuprofen with some improvement.  Denies any numbness or tingling.   Fall    History reviewed. No pertinent past medical history.  Patient Active Problem List   Diagnosis Date Noted   Body mass index 26.0-26.9, adult 02/15/2022   Right foot injury 03/27/2016   History of drug allergy 03/03/2016   Scabies 11/08/2015   Corneal abrasion, right 01/12/2013   Wears contact lenses 01/12/2013   Sports physical 09/04/2011   COMMON MIGRAINE 08/09/2007    Past Surgical History:  Procedure Laterality Date   HERNIA REPAIR         Home Medications    Prior to Admission medications   Not on File    Family History Family History  Problem Relation Age of Onset   Sudden death Neg Hx    Heart attack Neg Hx    Allergic rhinitis Neg Hx    Asthma Neg Hx     Social History Social History   Tobacco Use   Smoking status: Never   Smokeless tobacco: Never  Substance Use Topics   Alcohol use: Yes   Drug use: No     Allergies   Patient has no known allergies.   Review of Systems Review of Systems Per HPI  Physical Exam Triage Vital Signs ED Triage Vitals  Enc Vitals Group     BP 05/01/22 1013 137/90     Pulse Rate 05/01/22 1013 69     Resp 05/01/22 1013 18     Temp 05/01/22 1013 98 F (36.7 C)     Temp Source 05/01/22 1013 Oral     SpO2 05/01/22 1013 98 %     Weight --      Height --      Head Circumference --      Peak Flow --      Pain Score 05/01/22 1012 6     Pain Loc --       Pain Edu? --      Excl. in GC? --    No data found.  Updated Vital Signs BP 137/90 (BP Location: Left Arm)   Pulse 69   Temp 98 F (36.7 C) (Oral)   Resp 18   SpO2 98%   Visual Acuity Right Eye Distance:   Left Eye Distance:   Bilateral Distance:    Right Eye Near:   Left Eye Near:    Bilateral Near:     Physical Exam Constitutional:      General: He is not in acute distress.    Appearance: Normal appearance. He is not toxic-appearing or diaphoretic.  HENT:     Head: Normocephalic and atraumatic.  Eyes:     Extraocular Movements: Extraocular movements intact.     Conjunctiva/sclera: Conjunctivae normal.  Pulmonary:     Effort: Pulmonary effort is normal.  Musculoskeletal:     Comments: Moderate swelling located to lateral malleolus of left ankle.  Tenderness to palpation to this area as well.  No obvious discoloration noted.  Patient has minimal range of motion of ankle due to pain.  No tenderness to foot, lower leg, toes.  Neurovascular intact.  No abrasions or lacerations noted.  Neurological:     General: No focal deficit present.     Mental Status: He is alert and oriented to person, place, and time. Mental status is at baseline.  Psychiatric:        Mood and Affect: Mood normal.        Behavior: Behavior normal.        Thought Content: Thought content normal.        Judgment: Judgment normal.      UC Treatments / Results  Labs (all labs ordered are listed, but only abnormal results are displayed) Labs Reviewed - No data to display  EKG   Radiology DG Ankle Complete Left  Result Date: 05/01/2022 CLINICAL DATA:  Left-sided ankle pain. EXAM: LEFT ANKLE COMPLETE - 3+ VIEW COMPARISON:  None Available. FINDINGS: Soft tissue swelling about the lateral malleolus with small ankle joint effusion but without associated fracture or dislocation. Joint spaces are preserved. The ankle mortise is preserved. A punctate bone island is seen within the anterior aspect of  the distal tibial diaphysis. No plantar calcaneal spur. No radiopaque foreign body. IMPRESSION: Soft tissue swelling about the lateral malleolus and small ankle joint effusion without associated fracture or dislocation. Electronically Signed   By: Simonne Come M.D.   On: 05/01/2022 10:34    Procedures Procedures (including critical care time)  Medications Ordered in UC Medications - No data to display  Initial Impression / Assessment and Plan / UC Course  I have reviewed the triage vital signs and the nursing notes.  Pertinent labs & imaging results that were available during my care of the patient were reviewed by me and considered in my medical decision making (see chart for details).     Left ankle x-ray was negative for any acute fractures or dislocations.  It does show some moderate soft tissue swelling as well as a joint effusion.  Patient offered Ace wrap and crutches but declined.  Risks associated with not doing these measures were discussed with patient.  Patient voiced understanding.  Suspect muscular strain/injury.  Patient advised of nonweightbearing, RICE, over-the-counter pain relievers.  Patient to follow-up in the next few days with provided contact information for orthopedist given that he is having difficulty bearing weight for further evaluation and management.  Discussed return precautions.  Patient verbalized understanding and was agreeable with plan. Final Clinical Impressions(s) / UC Diagnoses   Final diagnoses:  Acute left ankle pain  Fall, initial encounter     Discharge Instructions      Your x-ray did not show any breaks or dislocations.  I suspect that you have an ankle sprain.  An Ace wrap has been applied and crutches has been supplied for you.  Do not sleep in Ace wrap.  Please do not bear any weight until otherwise advised.  Also recommend ice application, elevation of extremity, taking over-the-counter pain relievers as needed.  Please follow-up with  orthopedist at provided contact information for further evaluation and management.     ED Prescriptions   None    PDMP not reviewed this encounter.   Gustavus Bryant, [place] 05/01/22 1052    [address], [place] 05/01/22 1055

## 2022-05-01 NOTE — ED Triage Notes (Signed)
Pt present a fall, and rolled his left ankle last night.  Pt left ankle is swollen with some discoloration above his left ankle.

## 2022-08-13 ENCOUNTER — Emergency Department (HOSPITAL_COMMUNITY)
Admission: EM | Admit: 2022-08-13 | Discharge: 2022-08-13 | Discharge status: Against Medical Advice | Payer: 59 | Attending: Emergency Medicine | Admitting: Emergency Medicine

## 2022-08-13 ENCOUNTER — Encounter (HOSPITAL_COMMUNITY): Payer: Self-pay

## 2022-08-13 DIAGNOSIS — T40601A Poisoning by unspecified narcotics, accidental (unintentional), initial encounter: Secondary | ICD-10-CM

## 2022-08-13 DIAGNOSIS — R000 Tachycardia, unspecified: Secondary | ICD-10-CM | POA: Insufficient documentation

## 2022-08-13 DIAGNOSIS — F1020 Alcohol dependence, uncomplicated: Secondary | ICD-10-CM | POA: Insufficient documentation

## 2022-08-13 DIAGNOSIS — R519 Headache, unspecified: Secondary | ICD-10-CM | POA: Insufficient documentation

## 2022-08-13 LAB — CBC WITH DIFFERENTIAL/PLATELET
Abs Immature Granulocytes: 0.02 10*3/uL (ref 0.00–0.07)
Basophils Absolute: 0 10*3/uL (ref 0.0–0.1)
Basophils Relative: 0 %
Eosinophils Absolute: 0.1 10*3/uL (ref 0.0–0.5)
Eosinophils Relative: 1 %
HCT: 44.5 % (ref 39.0–52.0)
Hemoglobin: 15.7 g/dL (ref 13.0–17.0)
Immature Granulocytes: 0 %
Lymphocytes Relative: 18 %
Lymphs Abs: 1.2 10*3/uL (ref 0.7–4.0)
MCH: 32 pg (ref 26.0–34.0)
MCHC: 35.3 g/dL (ref 30.0–36.0)
MCV: 90.8 fL (ref 80.0–100.0)
Monocytes Absolute: 0.5 10*3/uL (ref 0.1–1.0)
Monocytes Relative: 7 %
Neutro Abs: 5.2 10*3/uL (ref 1.7–7.7)
Neutrophils Relative %: 74 %
Platelets: 177 10*3/uL (ref 150–400)
RBC: 4.9 MIL/uL (ref 4.22–5.81)
RDW: 11.8 % (ref 11.5–15.5)
WBC: 7 10*3/uL (ref 4.0–10.5)
nRBC: 0 % (ref 0.0–0.2)

## 2022-08-13 LAB — BASIC METABOLIC PANEL
Anion gap: 8 (ref 5–15)
BUN: 13 mg/dL (ref 6–20)
CO2: 25 mmol/L (ref 22–32)
Calcium: 9.2 mg/dL (ref 8.9–10.3)
Chloride: 105 mmol/L (ref 98–111)
Creatinine, Ser: 1.1 mg/dL (ref 0.61–1.24)
GFR, Estimated: >60 mL/min (ref >60)
Glucose, Bld: 132 mg/dL — ABNORMAL HIGH (ref 70–99)
Potassium: 3.3 mmol/L — ABNORMAL LOW (ref 3.5–5.1)
Sodium: 138 mmol/L (ref 135–145)

## 2022-08-13 LAB — ETHANOL: Alcohol, Ethyl (B): 24 mg/dL — ABNORMAL HIGH (ref <10)

## 2022-08-13 LAB — SALICYLATE LEVEL: Salicylate Lvl: <7 mg/dL — ABNORMAL LOW (ref 7.0–30.0)

## 2022-08-13 LAB — ACETAMINOPHEN LEVEL: Acetaminophen (Tylenol), Serum: <10 ug/mL — ABNORMAL LOW (ref 10–30)

## 2022-08-13 MED ORDER — ONDANSETRON HCL 4 MG/2ML IJ SOLN
4.0000 mg | Freq: Once | INTRAMUSCULAR | Status: DC
  Filled 2022-08-13: qty 2

## 2022-08-13 MED ORDER — NALOXONE HCL 4 MG/0.1ML NA LIQD: NASAL | 1 refills | Status: DC

## 2022-08-13 NOTE — ED Provider Notes (Signed)
[facility] DEPT Provider Note   CSN: [id number] Arrival date & time: 08/13/22  2115     History  Chief Complaint  Patient presents with   Drug Overdose    Midas Feimster Landsberg is a 26 y.o. male with migraines, alcohol dependence presents with reported drug overdose.   Patient presents by EMS after his brother-in-law found him unconscious and apneic in their bathroom.  Patient's brother-in-law called 911 and EMS administered Narcan.  Interviewed without his brother-in-law or father in the room, patient states that he snorted half a Percocet that he got from someone at the bar, took half a bar of Xanax by mouth, and took a couple shots of alcohol and then went into the bathroom, that is lasting he remembers.  Patient states this is never happened before.  He states he does not do this regularly.  Endorses mild headache and nausea vomiting, denies any chest pain, shortness of breath, abdominal pain, extremity pain.  Drug Overdose       Home Medications Prior to Admission medications   Not on File      Allergies    Patient has no known allergies.    Review of Systems   Review of Systems Review of systems negative for shortness of breath.  A 10 point review of systems was performed and is negative unless otherwise reported in HPI.  Physical Exam Updated Vital Signs BP (!) 150/110 (BP Location: Left Arm)   Pulse (!) 120   Temp 97.7 F (36.5 C) (Oral)   Resp 20   Ht 5\' 10"  (1.778 m)   Wt 86.2 kg   SpO2 97%   BMI 27.26 kg/m  Physical Exam General: Normal appearing male, sitting in bed actively vomiting. HEENT: PERRLA, injected conjunctivae bilaterally, sclera anicteric, MMM, trachea midline. NCAT, no c-spine tenderness or stepoffs.  Cardiology: RRR, no murmurs/rubs/gallops. BL radial and DP pulses equal bilaterally.  Resp: Normal respiratory rate and effort. CTAB, no wheezes, rhonchi, crackles.  Abd: Soft, non-tender, non-distended. No rebound  tenderness or guarding.  GU: Deferred. MSK: No peripheral edema or signs of trauma. Extremities without deformity or TTP. No cyanosis or clubbing. Skin: warm, dry. No rashes or lesions. Neuro: A&Ox4, CNs II-XII grossly intact. MAEs. Sensation grossly intact.  Psych: Normal mood and affect.   ED Results / Procedures / Treatments   Labs (all labs ordered are listed, but only abnormal results are displayed) Labs Reviewed  CBC WITH DIFFERENTIAL/PLATELET  BASIC METABOLIC PANEL  ETHANOL  RAPID URINE DRUG SCREEN, HOSP PERFORMED  ACETAMINOPHEN LEVEL  SALICYLATE LEVEL    EKG None  Radiology No results found.  Procedures Procedures    Medications Ordered in ED Medications  ondansetron (ZOFRAN) injection 4 mg (has no administration in time range)    ED Course/ Medical Decision Making/ A&P                          Medical Decision Making Amount and/or Complexity of Data Reviewed Labs: ordered.  Risk Prescription drug management.    Patient is tachycardic into the 1 teens, satting well on room air, breathing at approximately 15 breaths/min, afebrile, A&Ox4.  Patient is overall well-appearing though actively vomiting.  Patient's symptoms and clinical history consistent with acute opiate overdose reversed with narcan by EMS. Accidental, not intentional, patient states he just wanted to get high. Patient snorted only half a percocet, likely that it was not actually percocet or that it contained fentanyl. Also  consider benzodiazepine overdose, but patient's symptoms responded to narcan w/ EMS. Consider concomitant overdoses and will obtain EtOH, tylenol, salicylate, and UDS.   Patient is A&OX4 at this time, stating he would like to leave the ED. Informed patient and his family that we recommend observation for a few hours.   EKG demonstrates sinus tachycardia, normal axis, normal intervals, nonspecific intraventricular conduction delay.  No prior for comparison.  Labs demonstrate  labs demonstrate potassium 3.3, glucose 132, no leukocytosis or anemia, negative salicylate or Tylenol levels, EtOH 24. I have personally reviewed and interpreted all labs and imaging.   11:19 PM Patient request to leave AMA.  States he understands that I am recommending another several hours of observation in the setting of an opiate overdose receiving Narcan and he states that he accepts the risk of permanent morbidity or mortality as result of leaving today, including the Narcan wearing off and not being able to breathe again.  Patient is leaving with his mother and sister at bedside who agreed to monitor him.  Will give patient a prescription for Narcan when he leaves and have instructed patient and his family on how to use it and when to use it.  All questions answered to be satisfaction.  Dispo: DC AMA           Final Clinical Impression(s) / ED Diagnoses Final diagnoses:  Opiate overdose, accidental or unintentional, initial encounter [facility])    Rx / DC Orders ED Discharge Orders     None        This note was created using dictation software, which may contain spelling or grammatical errors.    Audley Hose, MD 08/13/22 2322

## 2022-08-13 NOTE — ED Triage Notes (Signed)
Pt states that he took a percocet about 30 minutes ago and took two shots, pt AxO x 4, denies SI/HI, states he was just trying to get high. Vomited x 1

## 2022-08-13 NOTE — Discharge Instructions (Addendum)
You elected to leave [facility].  You except the risk of severe disability or death as a result of leaving today.  We recommended further observation in the setting of an opiate overdose but you declined.  Please be monitored by family for the next several hours.  We have also prescribed a Narcan prescription to use 1 spray in the either nostril in the event of another opiate overdosed by you or witnessed by another. Please follow-up with your primary care physician within the week. Please do not hesitate to return the emergency department if you experience another overdose, difficulty breathing, fever/chills, or any other concerns.

## 2022-08-21 IMAGING — DX DG CHEST 2V
2 series · 2 of 2 positions shown · non-contrast
Comparison: None.

CLINICAL DATA: Cough and URI symptoms for 3x weeks normal
cardiomediastinal contours

EXAM:
CHEST - 2 VIEW

[chest pa]
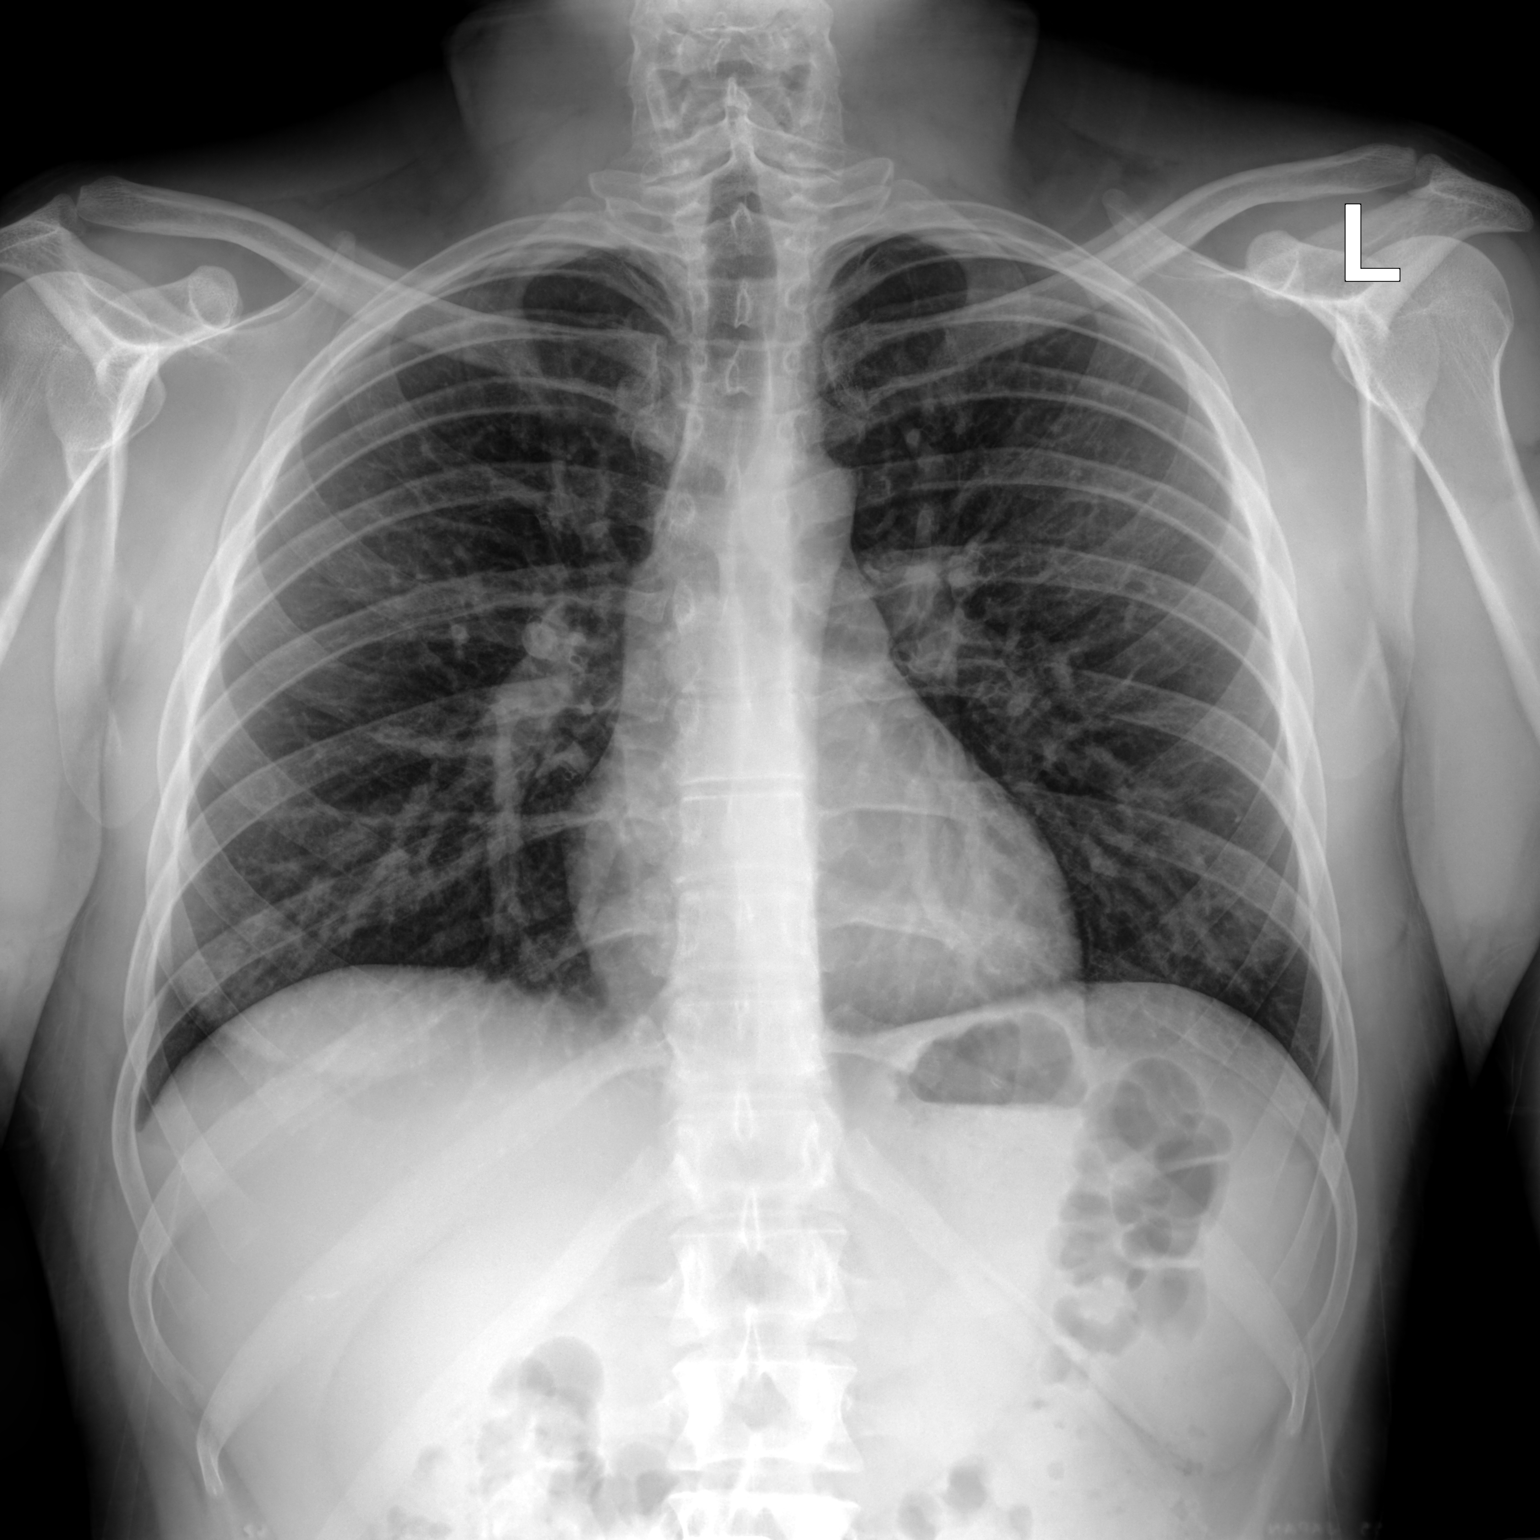

[chest lat]
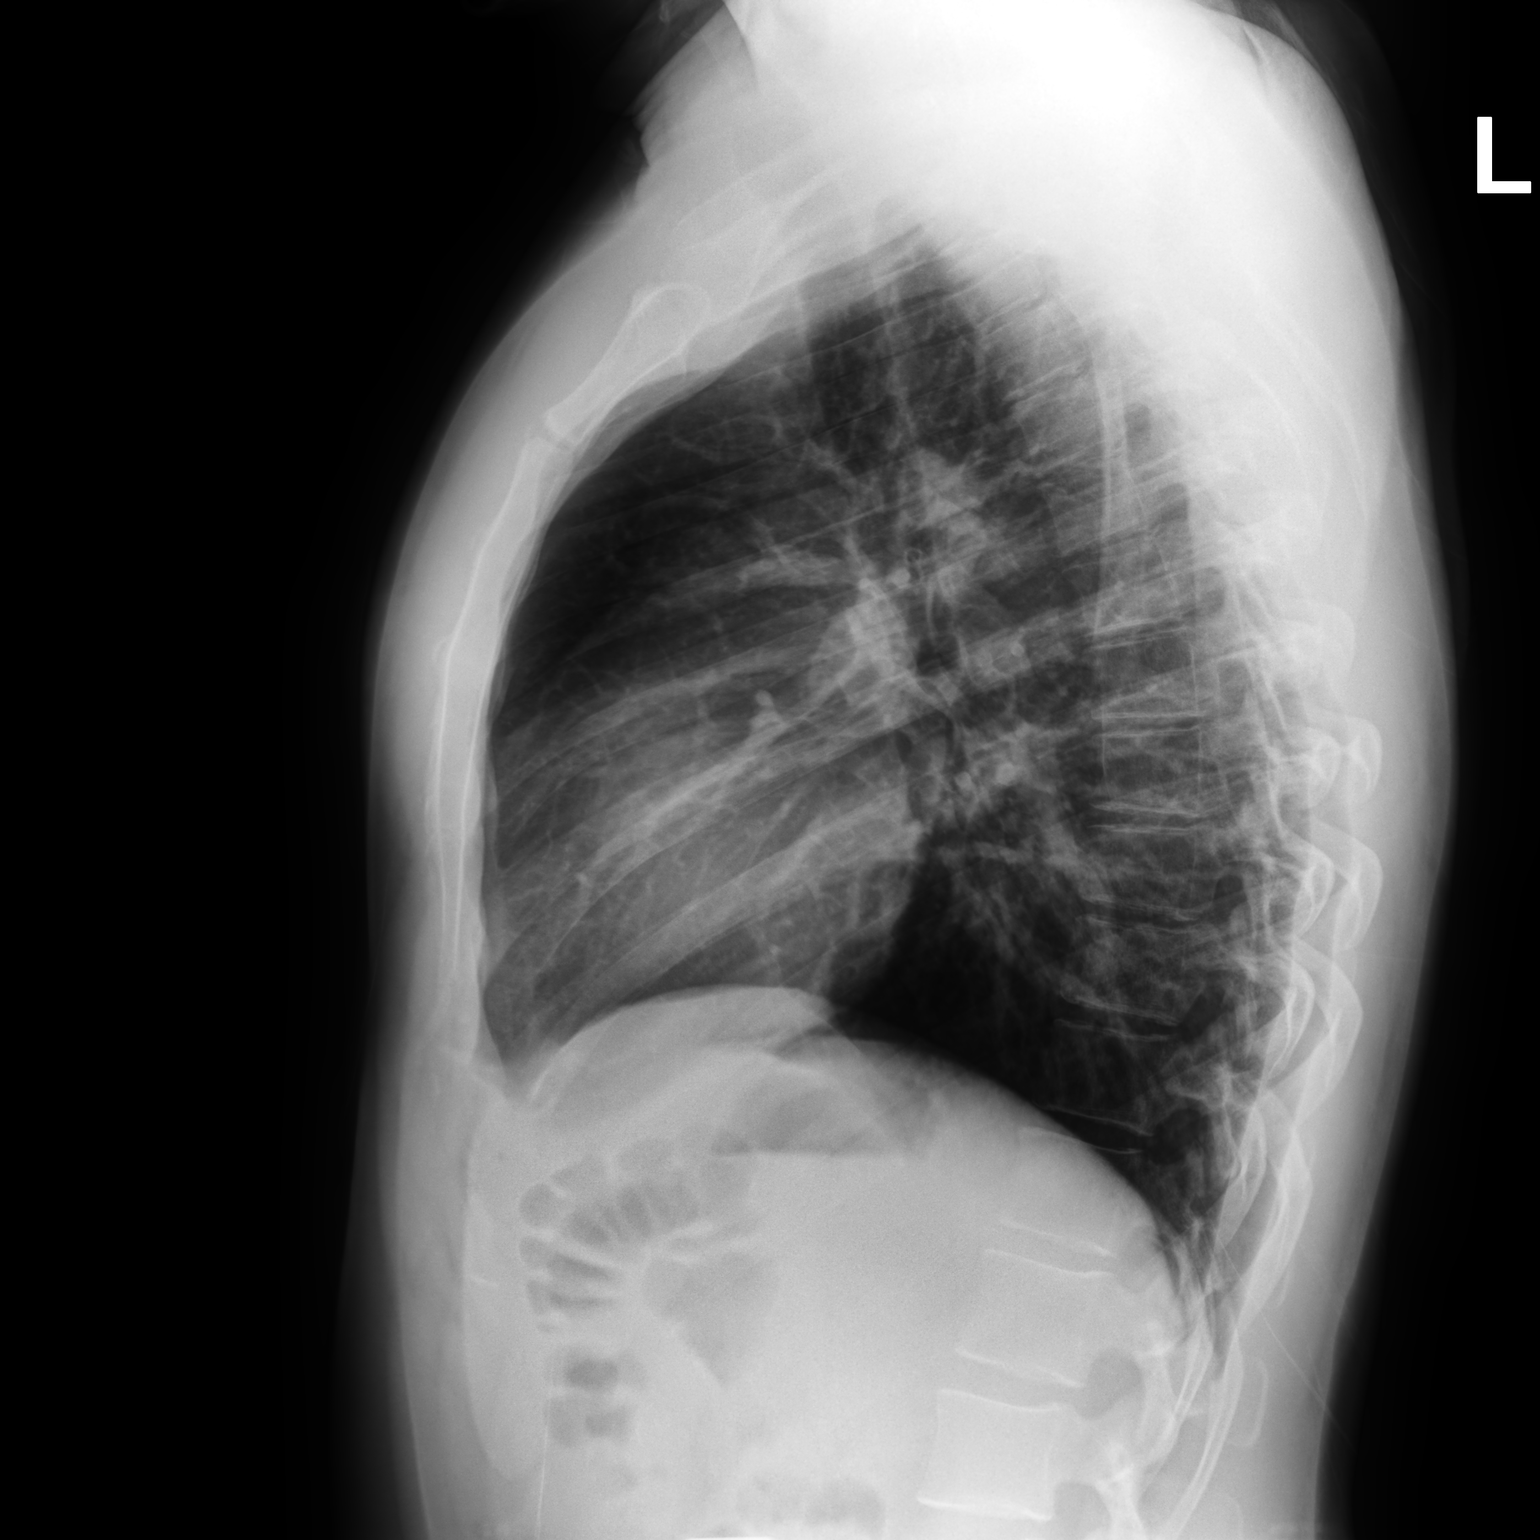

[2 of 2 positions shown; findings below may reference images not displayed]

FINDINGS: Normal cardiomediastinal contours. There is mild diffuse coarsening
of the interstitium. No focal consolidation. No pneumothorax or
pleural effusion. No acute finding in the visualized skeleton.
IMPRESSION: Mild diffuse coarsening of the interstitium bilaterally,
nonspecific, could represent atypical infection versus reactive
airways disease.

## 2024-02-12 ENCOUNTER — Observation Stay (HOSPITAL_COMMUNITY)
Admission: EM | Admit: 2024-02-12 | Discharge: 2024-02-13 | Disposition: A | Discharge status: Home / Self Care | Payer: Self-pay | Attending: Internal Medicine | Admitting: Internal Medicine

## 2024-02-12 ENCOUNTER — Encounter (HOSPITAL_COMMUNITY): Payer: Self-pay

## 2024-02-12 ENCOUNTER — Other Ambulatory Visit: Payer: Self-pay

## 2024-02-12 ENCOUNTER — Emergency Department (HOSPITAL_COMMUNITY): Payer: Self-pay

## 2024-02-12 DIAGNOSIS — F17200 Nicotine dependence, unspecified, uncomplicated: Secondary | ICD-10-CM

## 2024-02-12 DIAGNOSIS — R7989 Other specified abnormal findings of blood chemistry: Secondary | ICD-10-CM | POA: Insufficient documentation

## 2024-02-12 DIAGNOSIS — F1020 Alcohol dependence, uncomplicated: Secondary | ICD-10-CM | POA: Diagnosis present

## 2024-02-12 DIAGNOSIS — A419 Sepsis, unspecified organism: Principal | ICD-10-CM | POA: Insufficient documentation

## 2024-02-12 DIAGNOSIS — J189 Pneumonia, unspecified organism: Secondary | ICD-10-CM

## 2024-02-12 DIAGNOSIS — R079 Chest pain, unspecified: Secondary | ICD-10-CM

## 2024-02-12 DIAGNOSIS — J690 Pneumonitis due to inhalation of food and vomit: Secondary | ICD-10-CM | POA: Insufficient documentation

## 2024-02-12 DIAGNOSIS — E875 Hyperkalemia: Secondary | ICD-10-CM | POA: Insufficient documentation

## 2024-02-12 DIAGNOSIS — F17290 Nicotine dependence, other tobacco product, uncomplicated: Secondary | ICD-10-CM | POA: Insufficient documentation

## 2024-02-12 DIAGNOSIS — J9601 Acute respiratory failure with hypoxia: Principal | ICD-10-CM | POA: Diagnosis present

## 2024-02-12 LAB — RAPID URINE DRUG SCREEN, HOSP PERFORMED
Amphetamines: NOT DETECTED
Barbiturates: NOT DETECTED
Benzodiazepines: NOT DETECTED
Cocaine: NOT DETECTED
Opiates: NOT DETECTED
Tetrahydrocannabinol: POSITIVE — AB

## 2024-02-12 LAB — BASIC METABOLIC PANEL WITH GFR
Anion gap: 10 (ref 5–15)
Anion gap: 9 (ref 5–15)
BUN: 26 mg/dL — ABNORMAL HIGH (ref 6–20)
BUN: 22 mg/dL — ABNORMAL HIGH (ref 6–20)
CO2: 23 mmol/L (ref 22–32)
CO2: 26 mmol/L (ref 22–32)
Calcium: 8.9 mg/dL (ref 8.9–10.3)
Calcium: 9 mg/dL (ref 8.9–10.3)
Chloride: 101 mmol/L (ref 98–111)
Chloride: 100 mmol/L (ref 98–111)
Creatinine, Ser: 1.15 mg/dL (ref 0.61–1.24)
Creatinine, Ser: 1.14 mg/dL (ref 0.61–1.24)
GFR, Estimated: >60 mL/min (ref >60)
GFR, Estimated: >60 mL/min (ref >60)
Glucose, Bld: 101 mg/dL — ABNORMAL HIGH (ref 70–99)
Glucose, Bld: 141 mg/dL — ABNORMAL HIGH (ref 70–99)
Potassium: 5.9 mmol/L — ABNORMAL HIGH (ref 3.5–5.1)
Potassium: 5.3 mmol/L — ABNORMAL HIGH (ref 3.5–5.1)
Sodium: 134 mmol/L — ABNORMAL LOW (ref 135–145)
Sodium: 135 mmol/L (ref 135–145)

## 2024-02-12 LAB — TROPONIN I (HIGH SENSITIVITY)
Troponin I (High Sensitivity): 335 ng/L (ref <18)
Troponin I (High Sensitivity): 430 ng/L (ref <18)

## 2024-02-12 LAB — CBC WITH DIFFERENTIAL/PLATELET
Abs Immature Granulocytes: 0.05 10*3/uL (ref 0.00–0.07)
Basophils Absolute: 0 10*3/uL (ref 0.0–0.1)
Basophils Relative: 0 %
Eosinophils Absolute: 0 10*3/uL (ref 0.0–0.5)
Eosinophils Relative: 0 %
HCT: 51.5 % (ref 39.0–52.0)
Hemoglobin: 17 g/dL (ref 13.0–17.0)
Immature Granulocytes: 0 %
Lymphocytes Relative: 5 %
Lymphs Abs: 0.6 10*3/uL — ABNORMAL LOW (ref 0.7–4.0)
MCH: 31.4 pg (ref 26.0–34.0)
MCHC: 33 g/dL (ref 30.0–36.0)
MCV: 95.2 fL (ref 80.0–100.0)
Monocytes Absolute: 1.4 10*3/uL — ABNORMAL HIGH (ref 0.1–1.0)
Monocytes Relative: 10 %
Neutro Abs: 11.3 10*3/uL — ABNORMAL HIGH (ref 1.7–7.7)
Neutrophils Relative %: 85 %
Platelets: 253 10*3/uL (ref 150–400)
RBC: 5.41 MIL/uL (ref 4.22–5.81)
RDW: 12.8 % (ref 11.5–15.5)
WBC: 13.4 10*3/uL — ABNORMAL HIGH (ref 4.0–10.5)
nRBC: 0 % (ref 0.0–0.2)

## 2024-02-12 LAB — ACETAMINOPHEN LEVEL: Acetaminophen (Tylenol), Serum: <10 ug/mL — ABNORMAL LOW (ref 10–30)

## 2024-02-12 LAB — D-DIMER, QUANTITATIVE: D-Dimer, Quant: 0.81 ug{FEU}/mL — ABNORMAL HIGH (ref 0.00–0.50)

## 2024-02-12 LAB — CK: Total CK: 73 U/L (ref 49–397)

## 2024-02-12 LAB — POTASSIUM
Potassium: 6.2 mmol/L — ABNORMAL HIGH (ref 3.5–5.1)
Potassium: 3.9 mmol/L (ref 3.5–5.1)

## 2024-02-12 LAB — C-REACTIVE PROTEIN: CRP: 0.7 mg/dL (ref <1.0)

## 2024-02-12 LAB — MAGNESIUM: Magnesium: 2 mg/dL (ref 1.7–2.4)

## 2024-02-12 LAB — TSH: TSH: 0.234 u[IU]/mL — ABNORMAL LOW (ref 0.350–4.500)

## 2024-02-12 LAB — ETHANOL: Alcohol, Ethyl (B): <10 mg/dL (ref <10)

## 2024-02-12 LAB — SALICYLATE LEVEL: Salicylate Lvl: <7 mg/dL — ABNORMAL LOW (ref 7.0–30.0)

## 2024-02-12 LAB — SEDIMENTATION RATE: Sed Rate: 1 mm/h (ref 0–16)

## 2024-02-12 LAB — HIV ANTIBODY (ROUTINE TESTING W REFLEX): HIV Screen 4th Generation wRfx: NONREACTIVE

## 2024-02-12 LAB — LACTIC ACID, PLASMA: Lactic Acid, Venous: 1.7 mmol/L (ref 0.5–1.9)

## 2024-02-12 MED ORDER — ADULT MULTIVITAMIN W/MINERALS CH
1.0000 | ORAL_TABLET | Freq: Every day | ORAL | Status: DC
  Administered 2024-02-12 – 2024-02-13 (×2): 1 via ORAL
  Filled 2024-02-12 (×2): qty 1

## 2024-02-12 MED ORDER — THIAMINE HCL 100 MG/ML IJ SOLN: 100.0000 mg | Freq: Every day | INTRAMUSCULAR | Status: DC

## 2024-02-12 MED ORDER — ONDANSETRON HCL 4 MG/2ML IJ SOLN: 4.0000 mg | Freq: Four times a day (QID) | INTRAMUSCULAR | Status: DC | PRN

## 2024-02-12 MED ORDER — HYDROCODONE-ACETAMINOPHEN 5-325 MG PO TABS
1.0000 | ORAL_TABLET | ORAL | Status: DC | PRN
  Administered 2024-02-13: 1 via ORAL
  Filled 2024-02-12: qty 1

## 2024-02-12 MED ORDER — LORAZEPAM 2 MG/ML IJ SOLN: 1.0000 mg | INTRAMUSCULAR | Status: DC | PRN

## 2024-02-12 MED ORDER — ACETAMINOPHEN 650 MG RE SUPP: 650.0000 mg | Freq: Four times a day (QID) | RECTAL | Status: DC | PRN

## 2024-02-12 MED ORDER — LORAZEPAM 1 MG PO TABS: 1.0000 mg | ORAL_TABLET | ORAL | Status: DC | PRN

## 2024-02-12 MED ORDER — SODIUM CHLORIDE 0.9 % IV SOLN
3.0000 g | Freq: Four times a day (QID) | INTRAVENOUS | Status: DC
  Administered 2024-02-12 – 2024-02-13 (×4): 3 g via INTRAVENOUS
  Filled 2024-02-12 (×4): qty 8

## 2024-02-12 MED ORDER — DEXTROSE 50 % IV SOLN
1.0000 | Freq: Once | INTRAVENOUS | Status: AC
  Administered 2024-02-12: 50 mL via INTRAVENOUS
  Filled 2024-02-12: qty 50

## 2024-02-12 MED ORDER — INSULIN ASPART 100 UNIT/ML IV SOLN
5.0000 [IU] | Freq: Once | INTRAVENOUS | Status: AC
  Administered 2024-02-12: 5 [IU] via INTRAVENOUS
  Filled 2024-02-12: qty 0.05

## 2024-02-12 MED ORDER — ALBUTEROL SULFATE (2.5 MG/3ML) 0.083% IN NEBU
10.0000 mg | INHALATION_SOLUTION | Freq: Once | RESPIRATORY_TRACT | Status: AC
  Administered 2024-02-12: 10 mg via RESPIRATORY_TRACT
  Filled 2024-02-12: qty 12

## 2024-02-12 MED ORDER — LACTATED RINGERS IV BOLUS (SEPSIS)
1000.0000 mL | Freq: Once | INTRAVENOUS | Status: AC
  Administered 2024-02-12: 1000 mL via INTRAVENOUS

## 2024-02-12 MED ORDER — LACTATED RINGERS IV SOLN: INTRAVENOUS | Status: AC

## 2024-02-12 MED ORDER — ACETAMINOPHEN 325 MG PO TABS: 650.0000 mg | ORAL_TABLET | Freq: Four times a day (QID) | ORAL | Status: DC | PRN

## 2024-02-12 MED ORDER — LACTATED RINGERS IV BOLUS (SEPSIS)
250.0000 mL | Freq: Once | INTRAVENOUS | Status: AC
  Administered 2024-02-12: 250 mL via INTRAVENOUS

## 2024-02-12 MED ORDER — SODIUM CHLORIDE 0.9 % IV SOLN
3.0000 g | Freq: Once | INTRAVENOUS | Status: AC
  Administered 2024-02-12: 3 g via INTRAVENOUS
  Filled 2024-02-12: qty 8

## 2024-02-12 MED ORDER — ONDANSETRON HCL 4 MG PO TABS
4.0000 mg | ORAL_TABLET | Freq: Four times a day (QID) | ORAL | Status: DC | PRN
Start: 2024-02-12 — End: 2024-02-13

## 2024-02-12 MED ORDER — SODIUM CHLORIDE 0.9 % IV SOLN
500.0000 mg | Freq: Once | INTRAVENOUS | Status: AC
  Administered 2024-02-12: 500 mg via INTRAVENOUS
  Filled 2024-02-12: qty 5

## 2024-02-12 MED ORDER — FOLIC ACID 1 MG PO TABS
1.0000 mg | ORAL_TABLET | Freq: Every day | ORAL | Status: DC
Start: 2024-02-12 — End: 2024-02-13
  Administered 2024-02-12 – 2024-02-13 (×2): 1 mg via ORAL
  Filled 2024-02-12 (×2): qty 1

## 2024-02-12 MED ORDER — NICOTINE 14 MG/24HR TD PT24
14.0000 mg | MEDICATED_PATCH | Freq: Every day | TRANSDERMAL | Status: DC
  Administered 2024-02-12 – 2024-02-13 (×2): 14 mg via TRANSDERMAL
  Filled 2024-02-12 (×2): qty 1

## 2024-02-12 MED ORDER — THIAMINE MONONITRATE 100 MG PO TABS
100.0000 mg | ORAL_TABLET | Freq: Every day | ORAL | Status: DC
  Administered 2024-02-12 – 2024-02-13 (×2): 100 mg via ORAL
  Filled 2024-02-12 (×2): qty 1

## 2024-02-12 NOTE — Assessment & Plan Note (Signed)
 Likely MSK in nature from CPR Check inflammatory markers/troponin  Repeat EKG with no changes

## 2024-02-12 NOTE — Progress Notes (Signed)
 NP Denece Finger aknowledged the message. No orders given at the moment.

## 2024-02-12 NOTE — ED Notes (Signed)
 Pt is still drowsy but will respond when talked to.

## 2024-02-12 NOTE — ED Provider Notes (Signed)
 [facility] EMERGENCY DEPARTMENT AT [facility] Provider Note   CSN: [id number] Arrival date & time: 02/12/24  1327     History  No chief complaint on file.   Ilian Wessell Vlcek is a 28 y.o. male.  HPI   28 year old male presents to the emergency department after being found unresponsive on the floor next to his bed by mom.  She reported that he was foaming at the mouth, she did feel like he was breathing so she started CPR.  He started breathing on his own prior to EMS arrival.  EMS reports heart rate in the 140s with decreased mental status.  He was given a small dose of Narcan with slight improvement.  EMS reporting initial rhonchi and coughing with hypoxia prior to arrival.  Patient is a poor historian, still drowsy however does admit to smoking "something" prior to this event.  He does not know which he was attempting to smoke or is not forthcoming.  Denies any other ingestion or drug use.  Complaining of a mild headache and shortness of breath.  Mom is not at bedside, history is otherwise limited at this time.  Home Medications Prior to Admission medications   Medication Sig Start Date End Date Taking? Authorizing Provider  naloxone [facility]) nasal spray 4 mg/0.1 mL Use one spray in either nostril in the event of opiate overdose, I.e. not breathing 08/13/22   Merdis Stalling, MD      Allergies    Patient has no known allergies.    Review of Systems   Review of Systems  Unable to perform ROS: Mental status change    Physical Exam Updated Vital Signs BP 112/79   Pulse (!) 129   Temp 99 F (37.2 C) (Oral)   Resp 20   Ht 5\' 10"  (1.778 m)   Wt 72.6 kg   SpO2 94%   BMI 22.96 kg/m  Physical Exam Vitals and nursing note reviewed.  Constitutional:      Appearance: Normal appearance. He is ill-appearing.  HENT:     Head: Normocephalic.     Right Ear: External ear normal.     Left Ear: External ear normal.     Mouth/Throat:     Mouth: Mucous membranes are dry.   Eyes:     Comments: Pupils 2 to 3 mm, equal and reactive, eyes cross midline  Cardiovascular:     Rate and Rhythm: Tachycardia present.  Pulmonary:     Breath sounds: Rhonchi present.     Comments: Tachypneic  Abdominal:     Palpations: Abdomen is soft.  Musculoskeletal:     Cervical back: No rigidity.  Skin:    General: Skin is warm.  Neurological:     Mental Status: He is disoriented.     Comments: Moves all 4 extremities with equal strength, intermittently follows commands     ED Results / Procedures / Treatments   Labs (all labs ordered are listed, but only abnormal results are displayed) Labs Reviewed  CBC WITH DIFFERENTIAL/PLATELET - Abnormal; Notable for the following components:      Result Value   WBC 13.4 (*)    Neutro Abs 11.3 (*)    Lymphs Abs 0.6 (*)    Monocytes Absolute 1.4 (*)    All other components within normal limits  BASIC METABOLIC PANEL WITH GFR - Abnormal; Notable for the following components:   Sodium 134 (*)    Potassium 5.9 (*)    Glucose, Bld 101 (*)  BUN 26 (*)    All other components within normal limits  ACETAMINOPHEN LEVEL - Abnormal; Notable for the following components:   Acetaminophen (Tylenol), Serum <10 (*)    All other components within normal limits  SALICYLATE LEVEL - Abnormal; Notable for the following components:   Salicylate Lvl <7.0 (*)    All other components within normal limits  CULTURE, BLOOD (SINGLE)  ETHANOL  LACTIC ACID, PLASMA    EKG None  Radiology DG Chest Port 1 View Result Date: 02/12/2024 CLINICAL DATA:  Shortness of breath EXAM: PORTABLE CHEST 1 VIEW COMPARISON:  None Available. FINDINGS: Airspace consolidation is present in the right lower lung zone. No pleural effusion or pneumothorax. IMPRESSION: 1. Airspace consolidation is present the right lower lung zone. Differential considerations include infectious infiltrate and aspiration. Electronically Signed   By: Reagan Camera M.D.   On: 02/12/2024 14:14     Procedures .Critical Care  Performed by: Flonnie Humphrey, DO Authorized by: Flonnie Humphrey, DO   Critical care provider statement:    Critical care time (minutes):  75   Critical care time was exclusive of:  Separately billable procedures and treating other patients   Critical care was necessary to treat or prevent imminent or life-threatening deterioration of the following conditions:  Sepsis and respiratory failure   Critical care was time spent personally by me on the following activities:  Development of treatment plan with patient or surrogate, discussions with consultants, evaluation of patient's response to treatment, examination of patient, ordering and review of laboratory studies, ordering and review of radiographic studies, ordering and performing treatments and interventions, pulse oximetry, re-evaluation of patient's condition and review of old charts   I assumed direction of critical care for this patient from another provider in my specialty: no     Care discussed with: admitting provider       Medications Ordered in ED Medications  lactated ringers infusion (has no administration in time range)  lactated ringers bolus 1,000 mL (1,000 mLs Intravenous New Bag/Given 02/12/24 1432)    And  lactated ringers bolus 1,000 mL (1,000 mLs Intravenous New Bag/Given 02/12/24 1436)    And  lactated ringers bolus 250 mL (has no administration in time range)  Ampicillin-Sulbactam (UNASYN) 3 g in sodium chloride 0.9 % 100 mL IVPB (has no administration in time range)  azithromycin (ZITHROMAX) 500 mg in sodium chloride 0.9 % 250 mL IVPB (500 mg Intravenous New Bag/Given 02/12/24 1438)    ED Course/ Medical Decision Making/ A&P                                 Medical Decision Making Amount and/or Complexity of Data Reviewed Labs: ordered. Radiology: ordered.  Risk Prescription drug management. Decision regarding hospitalization.   28 year old male who presents to the  emergency department after being found unresponsive on the ground next to his bed by his mother, foaming at the mouth.  He self reports that he smoked something just prior to that but is unclear what he thought he was smoking or what the substance was.  There was a white/purple substance found in his contact placed by EMS.    Mom is now at bedside and states that the last time she saw him was last night, she states that he appeared stoned.  She came over today to check on him and that is when she heard the gurgling respirations and found him on  the ground with substance around his mouth with abnormal breathing.  She states that he did not appear well and she started CPR until he seemed to respond and she called EMS.  On arrival patient is hypoxic, doing well with about 2 L nasal cannula, rhonchorous breath sounds.  He had 2 to 3 mm pupils bilaterally but respirations were tachypneic.  He was given Narcan prior to arrival.  Protecting his airway at this time.  Chest x-ray was concerning for right lower lobe pneumonia, most likely aspiration.  Blood work shows a leukocytosis, he is tachycardic and code sepsis was placed along with antibiotic and IV fluid orders.  On reevaluation he is much more awake and interactive, pupils are about 4 mm.  He is still not forthcoming with certain information but appears improved.  Concern for sepsis secondary to aspiration pneumonia.  Systolic blood pressure dropped into the high 90s but MAP maintained.  No hypotension or shock, lactic is normal.  Patients evaluation and results requires admission for further treatment and care.  Spoke with hospitalist, reviewed patient's ED course and they accept admission.  Patient agrees with admission plan, offers no new complaints and is stable/unchanged at time of admit.          Final Clinical Impression(s) / ED Diagnoses Final diagnoses:  None    Rx / DC Orders ED Discharge Orders     None         Flonnie Humphrey, DO 02/12/24 1631

## 2024-02-12 NOTE — Assessment & Plan Note (Signed)
 History difficult, but does not drink daily No history of withdrawals CIWA protocol for now

## 2024-02-12 NOTE — Progress Notes (Signed)
 Elink is following code sepsis.

## 2024-02-12 NOTE — Assessment & Plan Note (Signed)
 Requested nicotine patch  Get his vape from gas station UDS pending, unsure if possible other substance in vape  Really encouraged him to stop this

## 2024-02-12 NOTE — H&P (Addendum)
 History and Physical    Patient: Isaac Atkins MVH:[id number] DOB: 30-Apr-1996 DOA: 02/12/2024 DOS: the patient was seen and examined on 02/12/2024 PCP: Sharyon Deis, NP  Patient coming from: Home - lives in a camper on his parent's property    Chief Complaint: AMS   HPI: Isaac Atkins is a 28 y.o. male with medical history significant of alcohol abuse, migraines who presented to ED with AMS. His mom gives history. This morning around 10AM she texted him and he didn't answer her. She called him and he didn't answer which was not normal. He also hadn't let his dog out which was not normal. She went over to the camper and found him on his bed and unresponsive. He was breathing really gurgling and she tried to push him on his side. He was sweaty and his face didn't look good, pale. She called 911. He was breathing, but she started CPR, but when EMS arrived he was breathing with a pulse and hypoxic on room air. She denies any shaking, urinary incontinence and tongue biting. He was given narcan with slight improvement. He did start talking more. He doesn't remember much except for waking up here in ED.   He has some left sided chest pain that is worse with coughing. He denies any recent illness or fevers. No shortness of breath. He does have a headache.    Denies any fever/chills, vision changes, chest pain or palpitations, shortness of breath or cough, abdominal pain, N/V/D, dysuria or leg swelling.    He smokes cigarettes, but not daily. Drinks alcohol, but not daily. ? Every other day. 6 pack of beer and some shots.  History of taking xanax bar hat was laced with fentanyl one year ago. He vapes on daily basis, unsure if had anything else with it. Hx of other drugs in the past, but not a regular basis.   ER Course:  vitals: afebrile, bp: 112/79, HR: 129, RR: 20, oxygen: 88%RA>96% on [place] Pertinent labs: wbc: 13.4, potassium: 5.9, bUN ;26,  CXR: airspace consolidation present in RLL field.  Differential includes infiltration and aspiration.  In ED: code sepsis activated. Given 2L+280ml IVF bolus and maintenance fluids. Started on  Unasyn and azithromycin. TRH asked to admit.     Review of Systems: As mentioned in the history of present illness. All other systems reviewed and are negative. History reviewed. No pertinent past medical history. Past Surgical History:  Procedure Laterality Date   HERNIA REPAIR     Social History:  reports that he has never smoked. He has never used smokeless tobacco. He reports current alcohol use. He reports current drug use.  Allergies  Allergen Reactions   Other     Pollen =     Family History  Problem Relation Age of Onset   Sudden death Neg Hx    Heart attack Neg Hx    Allergic rhinitis Neg Hx    Asthma Neg Hx     Prior to Admission medications   Medication Sig Start Date End Date Taking? Authorizing Provider  naloxone [facility]) nasal spray 4 mg/0.1 mL Use one spray in either nostril in the event of opiate overdose, I.e. not breathing 08/13/22   Merdis Stalling, MD    Physical Exam: Vitals:   02/12/24 1515 02/12/24 1530 02/12/24 1600 02/12/24 1752  BP: 91/73 97/70 98/77    Pulse: (!) 108 (!) 108 (!) 108   Resp: 18 (!) 22 16   Temp:    98.4  F (36.9 C)  TempSrc:    Oral  SpO2: 99% 96% 96%   Weight:      Height:       General:  Appears calm and comfortable and is in NAD Eyes:  PERRL, EOMI, normal lids, iris ENT:  grossly normal hearing, lips & tongue, mmm; appropriate dentition Neck:  no LAD, masses or thyromegaly; no carotid bruits Cardiovascular:  RRR, no m/r/g. No LE edema.  Respiratory:   CTA bilaterally with no wheezes/rales/rhonchi.  Normal respiratory effort. Abdomen:  soft, NT, ND, NABS Back:   normal alignment, no CVAT Skin:  no rash or induration seen on limited exam Musculoskeletal:  grossly normal tone BUE/BLE, good ROM, no bony abnormality Lower extremity:  No LE edema.  Limited foot exam with no  ulcerations.  2+ distal pulses. Psychiatric:  grossly normal mood and affect, speech fluent and appropriate, AOx3 Neurologic:  CN 2-12 grossly intact, moves all extremities in coordinated fashion, sensation intact   Radiological Exams on Admission: Independently reviewed - see discussion in A/P where applicable  DG Chest Port 1 View Result Date: 02/12/2024 CLINICAL DATA:  Shortness of breath EXAM: PORTABLE CHEST 1 VIEW COMPARISON:  None Available. FINDINGS: Airspace consolidation is present in the right lower lung zone. No pleural effusion or pneumothorax. IMPRESSION: 1. Airspace consolidation is present the right lower lung zone. Differential considerations include infectious infiltrate and aspiration. Electronically Signed   By: Reagan Camera M.D.   On: 02/12/2024 14:14    EKG: Independently reviewed.  Sinus tachycardia with rate 128; nonspecific ST changes with no evidence of acute ischemia  Repeat EKG NSR rate of 81    Labs on Admission: I have personally reviewed the available labs and imaging studies at the time of the admission.  Pertinent labs:   wbc: 13.4,  potassium: 5.9,  bUN ;26,  Assessment and Plan: Principal Problem:   Acute hypoxic respiratory failure secondary to aspiration pneumonia Active Problems:   Hyperkalemia   Chest pain   Alcohol dependence, uncomplicated (HCC)   Vaping nicotine dependence, non-tobacco product   Aspiration pneumonia (HCC)   Sepsis (HCC)    Assessment and Plan: * Acute hypoxic respiratory failure secondary to aspiration pneumonia 28 year old presenting to ED with hypoxia and AMS after his mom found him on his bed and unresponsive. He met sepsis criteria with exam and imaging concerning for aspiration pneumonia  -obs to progressive -have weaned down to 1L oxygen with good saturation  -CXR consistent with aspiration pneumonia or consolidation  -continue unasyn -BC obtained -lactic acid wnl  -sputum cx ordered, but not coughing   -check d-dimer with continued tachycardia -sepsis physiology resolving -concern possibly something in his vape, UDS pending -He has used drugs in the past, but denies any drug use last night  -does drink alcohol, but no alcohol last night  -no seizure like activity. CK wnl. Consider EEG if any change. No neurological deficits.  -no head trauma. Consider head CT if change in neurological exam  -continue wean oxygen as tolerated     Hyperkalemia Received IVF, unsure if hemolyzed sample Repeat>6.2 Giving albuterol, insulin/D50  Repeat potassium q 4/bmp now  Continue IVF On tele, no EKG changes   Chest pain Likely MSK in nature from CPR Check inflammatory markers/troponin  Repeat EKG with no changes   Alcohol dependence, uncomplicated (HCC) History difficult, but does not drink daily No history of withdrawals CIWA protocol for now   Vaping nicotine dependence, non-tobacco product Requested nicotine patch  Get his vape from gas station UDS pending, unsure if possible other substance in vape  Really encouraged him to stop this     Advance Care Planning:   Code Status: Full Code   Consults: none   DVT Prophylaxis: SCDs/ambulation   Family Communication: mother at bedside   Severity of Illness: The appropriate patient status for this patient is OBSERVATION. Observation status is judged to be reasonable and necessary in order to provide the required intensity of service to ensure the patient's safety. The patient's presenting symptoms, physical exam findings, and initial radiographic and laboratory data in the context of their medical condition is felt to place them at decreased risk for further clinical deterioration. Furthermore, it is anticipated that the patient will be medically stable for discharge from the hospital within 2 midnights of admission.   Author: Raymona Caldwell, MD 02/12/2024 6:18 PM  For on call review [email or web address].

## 2024-02-12 NOTE — Assessment & Plan Note (Addendum)
 Received IVF, unsure if hemolyzed sample Repeat>6.2 Giving albuterol, insulin/D50  Repeat potassium q 4/bmp now  Continue IVF On tele, no EKG changes

## 2024-02-12 NOTE — Progress Notes (Signed)
 Lab called to notify a critical lab for troponin of 335. Nurse Practitioner Denece Finger has been notified via secure chat.

## 2024-02-12 NOTE — ED Triage Notes (Addendum)
 Mom found his unresponsive at bedside. Foaming at mouth and wasn't breathing. Mom says she performed CPR. Left sided chest pain reported. Smoked something today but wasn't sure what it was. Narcan was given at scene. EMS 140 HR upon arrival. EMS reports ronchi and coughing.

## 2024-02-12 NOTE — Assessment & Plan Note (Addendum)
 28 year old presenting to ED with hypoxia and AMS after his mom found him on his bed and unresponsive. He met sepsis criteria with exam and imaging concerning for aspiration pneumonia  -obs to progressive -have weaned down to 1L oxygen with good saturation  -CXR consistent with aspiration pneumonia or consolidation  -continue unasyn -BC obtained -lactic acid wnl  -sputum cx ordered, but not coughing  -check d-dimer with continued tachycardia -sepsis physiology resolving -concern possibly something in his vape, UDS pending -He has used drugs in the past, but denies any drug use last night  -does drink alcohol, but no alcohol last night  -no seizure like activity. CK wnl. Consider EEG if any change. No neurological deficits.  -no head trauma. Consider head CT if change in neurological exam  -continue wean oxygen as tolerated

## 2024-02-13 ENCOUNTER — Observation Stay (HOSPITAL_COMMUNITY): Payer: Self-pay

## 2024-02-13 DIAGNOSIS — I428 Other cardiomyopathies: Secondary | ICD-10-CM

## 2024-02-13 LAB — CBC
HCT: 43.3 % (ref 39.0–52.0)
Hemoglobin: 14.2 g/dL (ref 13.0–17.0)
MCH: 31.7 pg (ref 26.0–34.0)
MCHC: 32.8 g/dL (ref 30.0–36.0)
MCV: 96.7 fL (ref 80.0–100.0)
Platelets: 150 10*3/uL (ref 150–400)
RBC: 4.48 MIL/uL (ref 4.22–5.81)
RDW: 12.6 % (ref 11.5–15.5)
WBC: 10.1 10*3/uL (ref 4.0–10.5)
nRBC: 0 % (ref 0.0–0.2)

## 2024-02-13 LAB — ECHOCARDIOGRAM COMPLETE
AR max vel: 3.33 cm2
AV Area VTI: 3.45 cm2
AV Area mean vel: 2.88 cm2
AV Mean grad: 5 mmHg
AV Peak grad: 9.5 mmHg
Ao pk vel: 1.54 m/s
Area-P 1/2: 4.49 cm2
Calc EF: 65.2 %
Height: 70 in
MV VTI: 2.9 cm2
S' Lateral: 3.1 cm
Single Plane A2C EF: 66.7 %
Single Plane A4C EF: 62.5 %
Weight: 2560 [oz_av]

## 2024-02-13 LAB — BASIC METABOLIC PANEL WITH GFR
Anion gap: 7 (ref 5–15)
BUN: 16 mg/dL (ref 6–20)
CO2: 29 mmol/L (ref 22–32)
Calcium: 9.2 mg/dL (ref 8.9–10.3)
Chloride: 102 mmol/L (ref 98–111)
Creatinine, Ser: 0.92 mg/dL (ref 0.61–1.24)
GFR, Estimated: >60 mL/min (ref >60)
Glucose, Bld: 127 mg/dL — ABNORMAL HIGH (ref 70–99)
Potassium: 3.9 mmol/L (ref 3.5–5.1)
Sodium: 138 mmol/L (ref 135–145)

## 2024-02-13 LAB — TROPONIN I (HIGH SENSITIVITY): Troponin I (High Sensitivity): 339 ng/L (ref <18)

## 2024-02-13 LAB — TSH: TSH: 0.225 u[IU]/mL — ABNORMAL LOW (ref 0.350–4.500)

## 2024-02-13 LAB — POTASSIUM: Potassium: 3.8 mmol/L (ref 3.5–5.1)

## 2024-02-13 MED ORDER — HYDRALAZINE HCL 20 MG/ML IJ SOLN: 10.0000 mg | INTRAMUSCULAR | Status: DC | PRN

## 2024-02-13 MED ORDER — ALBUTEROL SULFATE HFA 108 (90 BASE) MCG/ACT IN AERS: 1.0000 | INHALATION_SPRAY | Freq: Four times a day (QID) | RESPIRATORY_TRACT | 0 refills | Status: AC | PRN

## 2024-02-13 MED ORDER — GUAIFENESIN 100 MG/5ML PO LIQD: 5.0000 mL | ORAL | Status: DC | PRN

## 2024-02-13 MED ORDER — METOPROLOL TARTRATE 5 MG/5ML IV SOLN: 5.0000 mg | INTRAVENOUS | Status: DC | PRN

## 2024-02-13 MED ORDER — AMOXICILLIN-POT CLAVULANATE 875-125 MG PO TABS: 1.0000 | ORAL_TABLET | Freq: Two times a day (BID) | ORAL | 0 refills | Status: AC

## 2024-02-13 MED ORDER — SENNOSIDES-DOCUSATE SODIUM 8.6-50 MG PO TABS: 1.0000 | ORAL_TABLET | Freq: Every evening | ORAL | Status: DC | PRN

## 2024-02-13 NOTE — Discharge Summary (Signed)
 Physician Discharge Summary  Isaac Atkins ION:[id number] DOB: [birth year] DOA: 02/12/2024  PCP: Sharyon Deis, NP  Admit date: 02/12/2024 Discharge date: 02/13/2024  Admitted From: Home Disposition: Home  Recommendations for Outpatient Follow-up:  Follow up with PCP in 1-2 weeks Please obtain BMP/CBC in one week your next doctors visit.  Will need repeat chest x-ray in about 6-8 weeks 7 days of p.o. Augmentin As needed albuterol Counseled to quit using drugs   Discharge Condition: Stable CODE STATUS: Full code Diet recommendation: Regular  Brief/Interim Summary: Brief Narrative:  28 year old with history of alcohol use, migraines brought to the hospital by mom as patient was noted to be unresponsive.  Apparently initially CPR was initiated by the mother but when EMS arrived patient had a pulse and he had started talking more.  He reported of some left-sided chest discomfort.  Has prior history of alcohol abuse, tobacco use, vapes daily and in the past has taken Xanax more laced with fentanyl and other drugs.  Chest x-ray showed right lower lobe consolidation This morning feels back to his baseline and no other complaints.  Tells me this happened secondary to drug use.  Echocardiogram is stable and normal this afternoon.  He is wishing to go home.  Assessment & Plan:  Principal Problem:   Acute hypoxic respiratory failure secondary to aspiration pneumonia Active Problems:   Hyperkalemia   Chest pain   Alcohol dependence, uncomplicated (HCC)   Vaping nicotine dependence, non-tobacco product   Aspiration pneumonia (HCC)   Sepsis (HCC)    Unresponsive - Sec [place] to drug use.  Feels back to normal.  Does not want any further workup at this time besides echocardiogram as he feels normal   Acute hypoxic respiratory failure secondary to aspiration pneumonia Chest x-ray showing right lower lobe infiltrate concerns for aspiration.  Will transition to p.o. Augmentin for 7 more  days.  No evidence of hypoxia respiration status is stable.  Will also give him as needed albuterol.   Hyperkalemia Concerns this was hemolyzed.  Received albuterol, insulin/D50 Potassium 3.8 this morning  Elevated troponin Chest pain, musculoskeletal Patient reports and confirmed by mother that his chest pain is musculoskeletal in nature where mother attempted CPR. Troponin slightly elevated but EKG does not show any EKG changes.  Echocardiogram is overall unremarkable.   Alcohol dependence, uncomplicated (HCC) Counseled to quit using alcohol   Vaping nicotine dependence, non-tobacco product Requested nicotine patch  Get his vape from gas station UDS positive for THC   DVT prophylaxis: SCDs    Code Status: Full Code Family Communication: Mother at bedside Discharge today  Subjective: no complaints wishing to go home   Examination:  General exam: Appears calm and comfortable  Respiratory system: Clear to auscultation. Respiratory effort normal. Cardiovascular system: S1 & S2 heard, RRR. No JVD, murmurs, rubs, gallops or clicks. No pedal edema. Gastrointestinal system: Abdomen is nondistended, soft and nontender. No organomegaly or masses felt. Normal bowel sounds heard. Central nervous system: Alert and oriented. No focal neurological deficits. Extremities: Symmetric 5 x 5 power. Skin: No rashes, lesions or ulcers Psychiatry: Judgement and insight appear normal. Mood & affect appropriate.    Discharge Diagnoses:  Principal Problem:   Acute hypoxic respiratory failure secondary to aspiration pneumonia Active Problems:   Hyperkalemia   Chest pain   Alcohol dependence, uncomplicated (HCC)   Vaping nicotine dependence, non-tobacco product   Aspiration pneumonia (HCC)   Sepsis [facility])      Discharge Exam: Vitals:   02/13/24  0456 02/13/24 1244  BP: 124/78 138/85  Pulse: 93 65  Resp: 20 (!) 23  Temp: 98.4 F (36.9 C) 98.9 F (37.2 C)  SpO2: 93% 99%   Vitals:    02/12/24 2146 02/13/24 0015 02/13/24 0456 02/13/24 1244  BP:  111/63 124/78 138/85  Pulse:  86 93 65  Resp:  20 20 (!) 23  Temp:  97.9 F (36.6 C) 98.4 F (36.9 C) 98.9 F (37.2 C)  TempSrc:  Oral Oral Oral  SpO2: 94% 95% 93% 99%  Weight:      Height:          Discharge Instructions   Allergies as of 02/13/2024       Reactions   Other Itching, Other (See Comments), Cough   Pollen = Itchy eyes, runny nose, sneezing also        Medication List     STOP taking these medications    Alaway 0.035 % ophthalmic solution Generic drug: ketotifen   Benadryl Allergy 25 MG tablet Generic drug: diphenhydrAMINE   naloxone 4 MG/0.1ML Liqd nasal spray kit Commonly known as: NARCAN       TAKE these medications    albuterol 108 (90 Base) MCG/ACT inhaler Commonly known as: VENTOLIN HFA Inhale 1-2 puffs into the lungs every 6 (six) hours as needed for wheezing or shortness of breath.   amoxicillin-clavulanate 875-125 MG tablet Commonly known as: AUGMENTIN Take 1 tablet by mouth 2 (two) times daily for 7 days.        Allergies  Allergen Reactions   Other Itching, Other (See Comments) and Cough    Pollen = Itchy eyes, runny nose, sneezing also    You were cared for by a hospitalist during your hospital stay. If you have any questions about your discharge medications or the care you received while you were in the hospital after you are discharged, you can call the unit and asked to speak with the hospitalist on call if the hospitalist that took care of you is not available. Once you are discharged, your primary care physician will handle any further medical issues. Please note that no refills for any discharge medications will be authorized once you are discharged, as it is imperative that you return to your primary care physician (or establish a relationship with a primary care physician if you do not have one) for your aftercare needs so that they can reassess your need  for medications and monitor your lab values.  You were cared for by a hospitalist during your hospital stay. If you have any questions about your discharge medications or the care you received while you were in the hospital after you are discharged, you can call the unit and asked to speak with the hospitalist on call if the hospitalist that took care of you is not available. Once you are discharged, your primary care physician will handle any further medical issues. Please note that NO REFILLS for any discharge medications will be authorized once you are discharged, as it is imperative that you return to your primary care physician (or establish a relationship with a primary care physician if you do not have one) for your aftercare needs so that they can reassess your need for medications and monitor your lab values.  Please request your Prim.MD to go over all Hospital Tests and Procedure/Radiological results at the follow up, please get all Hospital records sent to your Prim MD by signing hospital release before you go home.  Get CBC, CMP,  2 view Chest X ray checked  by Primary MD during your next visit or SNF MD in 5-7 days ( we routinely change or add medications that can affect your baseline labs and fluid status, therefore we recommend that you get the mentioned basic workup next visit with your PCP, your PCP may decide not to get them or add new tests based on their clinical decision)  On your next visit with your primary care physician please Get Medicines reviewed and adjusted.  If you experience worsening of your admission symptoms, develop shortness of breath, life threatening emergency, suicidal or homicidal thoughts you must seek medical attention immediately by calling 911 or calling your MD immediately  if symptoms less severe.  You Must read complete instructions/literature along with all the possible adverse reactions/side effects for all the Medicines you take and that have been  prescribed to you. Take any new Medicines after you have completely understood and accpet all the possible adverse reactions/side effects.   Do not drive, operate heavy machinery, perform activities at heights, swimming or participation in water activities or provide baby sitting services if your were admitted for syncope or siezures until you have seen by Primary MD or a Neurologist and advised to do so again.  Do not drive when taking Pain medications.   Procedures/Studies: ECHOCARDIOGRAM COMPLETE Result Date: 02/13/2024    ECHOCARDIOGRAM REPORT   Patient Name:   Isaac Atkins Date of Exam: 02/13/2024 Medical Rec #:  [id number]     Height:       70.0 in Accession #:    [id number]    Weight:       160.0 lb Date of Birth:  Jul 03, 1996    BSA:          1.898 m Patient Age:    27 years      BP:           138/85 mmHg Patient Gender: M             HR:           74 bpm. Exam Location:  Inpatient Procedure: 2D Echo, Cardiac Doppler and Color Doppler (Both Spectral and Color            Flow Doppler were utilized during procedure). Indications:    Cardiomyopathy-unspecified  History:        Patient has no prior history of Echocardiogram examinations.                 Signs/Symptoms:Chest Pain.  Sonographer:    Juanita Shaw Referring Phys: [id number] Rae Sutcliffe C Adellyn Capek IMPRESSIONS  1. Left ventricular ejection fraction, by estimation, is 60 to 65%. The left ventricle has normal function. The left ventricle has no regional wall motion abnormalities. Left ventricular diastolic parameters are indeterminate.  2. Right ventricular systolic function is normal. The right ventricular size is mildly enlarged. Tricuspid regurgitation signal is inadequate for assessing PA pressure.  3. The mitral valve is normal in structure. No evidence of mitral valve regurgitation. No evidence of mitral stenosis.  4. The aortic valve is tricuspid. Aortic valve regurgitation is not visualized. No aortic stenosis is present.  5. Root diameter 39 mm.  6.  The inferior vena cava is normal in size with <50% respiratory variability, suggesting right atrial pressure of 8 mmHg. Comparison(s): No prior Echocardiogram. FINDINGS  Left Ventricle: Left ventricular ejection fraction, by estimation, is 60 to 65%. The left ventricle has normal function. The left ventricle has no regional wall motion  abnormalities. Strain was performed and the global longitudinal strain is indeterminate. The left ventricular internal cavity size was normal in size. There is no left ventricular hypertrophy. Left ventricular diastolic parameters are indeterminate. Right Ventricle: The right ventricular size is mildly enlarged. No increase in right ventricular wall thickness. Right ventricular systolic function is normal. Tricuspid regurgitation signal is inadequate for assessing PA pressure. Left Atrium: Left atrial size was normal in size. Right Atrium: Right atrial size was normal in size. Pericardium: There is no evidence of pericardial effusion. Mitral Valve: The mitral valve is normal in structure. No evidence of mitral valve regurgitation. No evidence of mitral valve stenosis. MV peak gradient, 3.9 mmHg. The mean mitral valve gradient is 2.0 mmHg. Tricuspid Valve: The tricuspid valve is normal in structure. Tricuspid valve regurgitation is not demonstrated. No evidence of tricuspid stenosis. Aortic Valve: The aortic valve is tricuspid. Aortic valve regurgitation is not visualized. No aortic stenosis is present. Aortic valve mean gradient measures 5.0 mmHg. Aortic valve peak gradient measures 9.5 mmHg. Aortic valve area, by VTI measures 3.45 cm. Pulmonic Valve: The pulmonic valve was normal in structure. Pulmonic valve regurgitation is not visualized. No evidence of pulmonic stenosis. Aorta: Root diameter 39 mm. The aortic root is normal in size and structure. Ascending aorta measurements are within normal limits for age when indexed to body surface area. Venous: The inferior vena cava is  normal in size with less than 50% respiratory variability, suggesting right atrial pressure of 8 mmHg. IAS/Shunts: The atrial septum is grossly normal. Additional Comments: 3D was performed not requiring image post processing on an independent workstation and was indeterminate.  LEFT VENTRICLE PLAX 2D LVIDd:         5.20 cm LVIDs:         3.10 cm LV PW:         0.90 cm LV IVS:        0.70 cm LVOT diam:     2.20 cm LV SV:         78 LV SV Index:   41 LVOT Area:     3.80 cm  LV Volumes (MOD) LV vol d, MOD A2C: 233.0 ml LV vol d, MOD A4C: 228.0 ml LV vol s, MOD A2C: 77.6 ml LV vol s, MOD A4C: 85.5 ml LV SV MOD A2C:     155.4 ml LV SV MOD A4C:     228.0 ml LV SV MOD BP:      154.4 ml RIGHT VENTRICLE             IVC RV Basal diam:  4.30 cm     IVC diam: 1.50 cm RV Mid diam:    2.40 cm RV S prime:     11.90 cm/s TAPSE (M-mode): 2.9 cm LEFT ATRIUM             Index        RIGHT ATRIUM           Index LA diam:        4.00 cm 2.11 cm/m   RA Area:     20.00 cm LA Vol (A2C):   70.5 ml 37.14 ml/m  RA Volume:   61.20 ml  32.24 ml/m LA Vol (A4C):   36.7 ml 19.33 ml/m LA Biplane Vol: 50.6 ml 26.65 ml/m  AORTIC VALVE                     PULMONIC VALVE AV Area (Vmax):  3.33 cm      PV Vmax:       0.89 m/s AV Area (Vmean):   2.88 cm      PV Peak grad:  3.2 mmHg AV Area (VTI):     3.45 cm AV Vmax:           154.00 cm/s AV Vmean:          102.000 cm/s AV VTI:            0.225 m AV Peak Grad:      9.5 mmHg AV Mean Grad:      5.0 mmHg LVOT Vmax:         135.00 cm/s LVOT Vmean:        77.400 cm/s LVOT VTI:          0.204 m LVOT/AV VTI ratio: 0.91  AORTA Ao Root diam: 4.00 cm Ao Asc diam:  2.20 cm MITRAL VALVE MV Area (PHT): 4.49 cm    SHUNTS MV Area VTI:   2.90 cm    Systemic VTI:  0.20 m MV Peak grad:  3.9 mmHg    Systemic Diam: 2.20 cm MV Mean grad:  2.0 mmHg MV Vmax:       0.99 m/s MV Vmean:      60.9 cm/s MV Decel Time: 169 msec MV E velocity: 82.00 cm/s MV A velocity: 81.40 cm/s MV E/A ratio:  1.01 Riley Lam  MD Electronically signed by Riley Lam MD Signature Date/Time: 02/13/2024/1:51:45 PM    Final    DG Chest Port 1 View Result Date: 02/12/2024 CLINICAL DATA:  Shortness of breath EXAM: PORTABLE CHEST 1 VIEW COMPARISON:  None Available. FINDINGS: Airspace consolidation is present in the right lower lung zone. No pleural effusion or pneumothorax. IMPRESSION: 1. Airspace consolidation is present the right lower lung zone. Differential considerations include infectious infiltrate and aspiration. Electronically Signed   By: Lowell Guitar M.D.   On: 02/12/2024 14:14     The results of significant diagnostics from this hospitalization (including imaging, microbiology, ancillary and laboratory) are listed below for reference.     Microbiology: Recent Results (from the past 240 hours)  Culture, blood (single)     Status: None (Preliminary result)   Collection Time: 02/12/24  2:15 PM   Specimen: BLOOD  Result Value Ref Range Status   Specimen Description   Final    BLOOD LEFT ANTECUBITAL Performed at [facility], [address] [place], [place] [address]    Special Requests   Final    BOTTLES DRAWN AEROBIC AND ANAEROBIC Blood Culture adequate volume Performed at [facility], [address] [place], [place] [address]    Culture   Final    NO GROWTH < 24 HOURS Performed at [facility], [address], [place], [place] [address]    Report Status PENDING  Incomplete     Labs: BNP (last 3 results) No results for input(s): "BNP" in the last 8760 hours. Basic Metabolic Panel: Recent Labs  Lab 02/12/24 1412 02/12/24 1647 02/12/24 1723 02/12/24 1848 02/12/24 2214 02/13/24 0219 02/13/24 1024  NA 134*  --   --  135  --   --  138  K 5.9*  --  6.2* 5.3* 3.9 3.8 3.9  CL 101  --   --  100  --   --  102  CO2 23  --   --  26  --   --  29  GLUCOSE 101*  --   --  141*  --   --  127*  BUN 26*  --   --  22*  --   --  16  CREATININE 1.15  --    --  1.14  --   --  0.92  CALCIUM 8.9  --   --  9.0  --   --  9.2  MG  --  2.0  --   --   --   --   --    Liver Function Tests: No results for input(s): "AST", "ALT", "ALKPHOS", "BILITOT", "PROT", "ALBUMIN" in the last 168 hours. No results for input(s): "LIPASE", "AMYLASE" in the last 168 hours. No results for input(s): "AMMONIA" in the last 168 hours. CBC: Recent Labs  Lab 02/12/24 1412 02/13/24 1024  WBC 13.4* 10.1  NEUTROABS 11.3*  --   HGB 17.0 14.2  HCT 51.5 43.3  MCV 95.2 96.7  PLT 253 150   Cardiac Enzymes: Recent Labs  Lab 02/12/24 1647  CKTOTAL 73   BNP: Invalid input(s): "POCBNP" CBG: No results for input(s): "GLUCAP" in the last 168 hours. D-Dimer Recent Labs    02/12/24 1848  DDIMER 0.81*   Hgb A1c No results for input(s): "HGBA1C" in the last 72 hours. Lipid Profile No results for input(s): "CHOL", "HDL", "LDLCALC", "TRIG", "CHOLHDL", "LDLDIRECT" in the last 72 hours. Thyroid function studies Recent Labs    02/12/24 1757  TSH 0.234*   Anemia work up No results for input(s): "VITAMINB12", "FOLATE", "FERRITIN", "TIBC", "IRON", "RETICCTPCT" in the last 72 hours. Urinalysis    Component Value Date/Time   BILIRUBINUR negative 11/06/2021 1316   KETONESUR negative 11/06/2021 1316   PROTEINUR negative 11/06/2021 1316   UROBILINOGEN 0.2 11/06/2021 1316   NITRITE Negative 11/06/2021 1316   LEUKOCYTESUR Negative 11/06/2021 1316   Sepsis Labs Recent Labs  Lab 02/12/24 1412 02/13/24 1024  WBC 13.4* 10.1   Microbiology Recent Results (from the past 240 hours)  Culture, blood (single)     Status: None (Preliminary result)   Collection Time: 02/12/24  2:15 PM   Specimen: BLOOD  Result Value Ref Range Status   Specimen Description   Final    BLOOD LEFT ANTECUBITAL Performed at [facility], [address] [place], [place] [address]    Special Requests   Final    BOTTLES DRAWN AEROBIC AND ANAEROBIC Blood Culture adequate  volume Performed at [facility], [address], [place], [place] [address]    Culture   Final    NO GROWTH < 24 HOURS Performed at [facility], [address] [place], [place] [address]    Report Status PENDING  Incomplete     Time coordinating discharge:  I have spent 35 minutes face to face with the patient and on the ward discussing the patients care, assessment, plan and disposition with other care givers. >50% of the time was devoted counseling the patient about the risks and benefits of treatment/Discharge disposition and coordinating care.   SIGNED:   Maggie Schooner, MD  [facility] 02/13/2024, 2:23 PM   If 7PM-7AM, please contact night-coverage

## 2024-02-13 NOTE — Hospital Course (Addendum)
 Brief Narrative:  28 year old with history of alcohol use, migraines brought to the hospital by mom as patient was noted to be unresponsive.  Apparently initially CPR was initiated by the mother but when EMS arrived patient had a pulse and he had started talking more.  He reported of some left-sided chest discomfort.  Has prior history of alcohol abuse, tobacco use, vapes daily and in the past has taken Xanax more laced with fentanyl and other drugs.  Chest x-ray showed right lower lobe consolidation This morning feels back to his baseline and no other complaints.  Tells me this happened secondary to drug use.  Echocardiogram is stable and normal this afternoon.  He is wishing to go home.  Assessment & Plan:  Principal Problem:   Acute hypoxic respiratory failure secondary to aspiration pneumonia Active Problems:   Hyperkalemia   Chest pain   Alcohol dependence, uncomplicated (HCC)   Vaping nicotine dependence, non-tobacco product   Aspiration pneumonia (HCC)   Sepsis (HCC)    Unresponsive - Sec [place] to drug use.  Feels back to normal.  Does not want any further workup at this time besides echocardiogram as he feels normal   Acute hypoxic respiratory failure secondary to aspiration pneumonia Chest x-ray showing right lower lobe infiltrate concerns for aspiration.  Will transition to p.o. Augmentin for 7 more days.  No evidence of hypoxia respiration status is stable.  Will also give him as needed albuterol.   Hyperkalemia Concerns this was hemolyzed.  Received albuterol, insulin/D50 Potassium 3.8 this morning  Elevated troponin Chest pain, musculoskeletal Patient reports and confirmed by mother that his chest pain is musculoskeletal in nature where mother attempted CPR. Troponin slightly elevated but EKG does not show any EKG changes.  Echocardiogram is overall unremarkable.   Alcohol dependence, uncomplicated (HCC) Counseled to quit using alcohol   Vaping nicotine dependence,  non-tobacco product Requested nicotine patch  Get his vape from gas station UDS positive for THC   DVT prophylaxis: SCDs    Code Status: Full Code Family Communication: Mother at bedside Discharge today  Subjective: no complaints wishing to go home   Examination:  General exam: Appears calm and comfortable  Respiratory system: Clear to auscultation. Respiratory effort normal. Cardiovascular system: S1 & S2 heard, RRR. No JVD, murmurs, rubs, gallops or clicks. No pedal edema. Gastrointestinal system: Abdomen is nondistended, soft and nontender. No organomegaly or masses felt. Normal bowel sounds heard. Central nervous system: Alert and oriented. No focal neurological deficits. Extremities: Symmetric 5 x 5 power. Skin: No rashes, lesions or ulcers Psychiatry: Judgement and insight appear normal. Mood & affect appropriate.

## 2024-02-13 NOTE — Progress Notes (Signed)
 PROGRESS NOTE    Isaac Atkins  ZOX:[id number] DOB: [birth year] DOA: 02/12/2024 PCP: Sharyon Deis, NP    Brief Narrative:  28 year old with history of alcohol use, migraines brought to the hospital by mom as patient was noted to be unresponsive.  Apparently initially CPR was initiated by the mother but when EMS arrived patient had a pulse and he had started talking more.  He reported of some left-sided chest discomfort.  Has prior history of alcohol abuse, tobacco use, vapes daily and in the past has taken Xanax more laced with fentanyl and other drugs.  Chest x-ray showed right lower lobe consolidation This morning feels back to his baseline and no other complaints.  Tells me this happened secondary to drug use.  Assessment & Plan:  Principal Problem:   Acute hypoxic respiratory failure secondary to aspiration pneumonia Active Problems:   Hyperkalemia   Chest pain   Alcohol dependence, uncomplicated (HCC)   Vaping nicotine dependence, non-tobacco product   Aspiration pneumonia (HCC)   Sepsis (HCC)    Unresponsive - [place] to drug use.  Feels back to normal.  Does not want any further workup at this time besides echocardiogram as he feels normal   Acute hypoxic respiratory failure secondary to aspiration pneumonia Chest x-ray showing right lower lobe infiltrate concerns for aspiration.  Currently started on Unasyn.  As needed bronchodilators.  D-dimer is minimally elevated.  Currently on room air, CK levels are normal.     Hyperkalemia Concerns this was hemolyzed.  Received albuterol, insulin/D50 Potassium 3.8 this morning  Chest pain Likely MSK in nature from CPR Troponins are trending downwards Will check echocardiogram   Alcohol dependence, uncomplicated (HCC) History difficult, but does not drink daily No history of withdrawals CIWA protocol for now    Vaping nicotine dependence, non-tobacco product Requested nicotine patch  Get his vape from gas  station UDS positive for THC   DVT prophylaxis: SCDs    Code Status: Full Code Family Communication: Mother at bedside Discharge later today if echocardiogram unremarkable    Subjective: Patient feels almost back to baseline this morning.  Mother is present at bedside.  He is wishing to go home today if his echocardiogram is normal.   Examination:  General exam: Appears calm and comfortable  Respiratory system: Clear to auscultation. Respiratory effort normal. Cardiovascular system: S1 & S2 heard, RRR. No JVD, murmurs, rubs, gallops or clicks. No pedal edema. Gastrointestinal system: Abdomen is nondistended, soft and nontender. No organomegaly or masses felt. Normal bowel sounds heard. Central nervous system: Alert and oriented. No focal neurological deficits. Extremities: Symmetric 5 x 5 power. Skin: No rashes, lesions or ulcers Psychiatry: Judgement and insight appear normal. Mood & affect appropriate.                Diet Orders (From admission, onward)     Start     Ordered   02/12/24 1740  Diet regular Room service appropriate? Yes; Fluid consistency: Thin  Diet effective now       Question Answer Comment  Room service appropriate? Yes   Fluid consistency: Thin      02/12/24 1739            Objective: Vitals:   02/12/24 2127 02/12/24 2146 02/13/24 0015 02/13/24 0456  BP: 127/84  111/63 124/78  Pulse: 85  86 93  Resp: 20  20 20   Temp: 98.3 F (36.8 C)  97.9 F (36.6 C) 98.4 F (36.9 C)  TempSrc: Oral  Oral Oral  SpO2: 100% 94% 95% 93%  Weight:      Height:        Intake/Output Summary (Last 24 hours) at 02/13/2024 1144 Last data filed at 02/13/2024 0320 Gross per 24 hour  Intake 4314.5 ml  Output --  Net 4314.5 ml   Filed Weights   02/12/24 1339  Weight: 72.6 kg    Scheduled Meds:  folic acid  1 mg Oral Daily   multivitamin with minerals  1 tablet Oral Daily   nicotine  14 mg Transdermal Daily   thiamine  100 mg Oral Daily   Or    thiamine  100 mg Intravenous Daily   Continuous Infusions:  ampicillin-sulbactam (UNASYN) IV 3 g (02/13/24 1109)    Nutritional status     Body mass index is 22.96 kg/m.  Data Reviewed:   CBC: Recent Labs  Lab 02/12/24 1412 02/13/24 1024  WBC 13.4* 10.1  NEUTROABS 11.3*  --   HGB 17.0 14.2  HCT 51.5 43.3  MCV 95.2 96.7  PLT 253 150   Basic Metabolic Panel: Recent Labs  Lab 02/12/24 1412 02/12/24 1647 02/12/24 1723 02/12/24 1848 02/12/24 2214 02/13/24 0219 02/13/24 1024  NA 134*  --   --  135  --   --  138  K 5.9*  --  6.2* 5.3* 3.9 3.8 3.9  CL 101  --   --  100  --   --  102  CO2 23  --   --  26  --   --  29  GLUCOSE 101*  --   --  141*  --   --  127*  BUN 26*  --   --  22*  --   --  16  CREATININE 1.15  --   --  1.14  --   --  0.92  CALCIUM 8.9  --   --  9.0  --   --  9.2  MG  --  2.0  --   --   --   --   --    GFR: Estimated Creatinine Clearance: 124.9 mL/min (by C-G formula based on SCr of 0.92 mg/dL). Liver Function Tests: No results for input(s): "AST", "ALT", "ALKPHOS", "BILITOT", "PROT", "ALBUMIN" in the last 168 hours. No results for input(s): "LIPASE", "AMYLASE" in the last 168 hours. No results for input(s): "AMMONIA" in the last 168 hours. Coagulation Profile: No results for input(s): "INR", "PROTIME" in the last 168 hours. Cardiac Enzymes: Recent Labs  Lab 02/12/24 1647  CKTOTAL 73   BNP (last 3 results) No results for input(s): "PROBNP" in the last 8760 hours. HbA1C: No results for input(s): "HGBA1C" in the last 72 hours. CBG: No results for input(s): "GLUCAP" in the last 168 hours. Lipid Profile: No results for input(s): "CHOL", "HDL", "LDLCALC", "TRIG", "CHOLHDL", "LDLDIRECT" in the last 72 hours. Thyroid Function Tests: Recent Labs    02/12/24 1757  TSH 0.234*   Anemia Panel: No results for input(s): "VITAMINB12", "FOLATE", "FERRITIN", "TIBC", "IRON", "RETICCTPCT" in the last 72 hours. Sepsis Labs: Recent Labs  Lab  02/12/24 1411  LATICACIDVEN 1.7    Recent Results (from the past 240 hours)  Culture, blood (single)     Status: None (Preliminary result)   Collection Time: 02/12/24  2:15 PM   Specimen: BLOOD  Result Value Ref Range Status   Specimen Description   Final    BLOOD LEFT ANTECUBITAL Performed at [facility], [address] [place], [place]  [address]    Special Requests   Final    BOTTLES DRAWN AEROBIC AND ANAEROBIC Blood Culture adequate volume Performed at [facility], [address] [place], [place] [address]    Culture   Final    NO GROWTH < 24 HOURS Performed at [facility], [address], [place], [place] [address]    Report Status PENDING  Incomplete         Radiology Studies: DG Chest Port 1 View Result Date: 02/12/2024 CLINICAL DATA:  Shortness of breath EXAM: PORTABLE CHEST 1 VIEW COMPARISON:  None Available. FINDINGS: Airspace consolidation is present in the right lower lung zone. No pleural effusion or pneumothorax. IMPRESSION: 1. Airspace consolidation is present the right lower lung zone. Differential considerations include infectious infiltrate and aspiration. Electronically Signed   By: Reagan Camera M.D.   On: 02/12/2024 14:14           LOS: 0 days   Time spent= 35 mins    Maggie Schooner, MD [facility]  If 7PM-7AM, please contact night-coverage  02/13/2024, 11:44 AM

## 2024-02-13 NOTE — Progress Notes (Signed)
  Echocardiogram 2D Echocardiogram has been performed.  Stacyann Mcconaughy L Jilliane Kazanjian RDCS 02/13/2024, 1:41 PM

## 2024-02-17 LAB — CULTURE, BLOOD (SINGLE)
Culture: NO GROWTH
Special Requests: ADEQUATE
